# Patient Record
Sex: Male | Born: 1935 | Race: White | Hispanic: No | Smoking: Former smoker
Health system: Southern US, Community
[De-identification: ages and names within clinical notes are randomized; demographics above are authoritative.]

## PROBLEM LIST (undated history)

## (undated) DIAGNOSIS — H04123 Dry eye syndrome of bilateral lacrimal glands: Secondary | ICD-10-CM

## (undated) DIAGNOSIS — I519 Heart disease, unspecified: Secondary | ICD-10-CM

## (undated) DIAGNOSIS — Z86718 Personal history of other venous thrombosis and embolism: Secondary | ICD-10-CM

## (undated) DIAGNOSIS — J302 Other seasonal allergic rhinitis: Secondary | ICD-10-CM

## (undated) HISTORY — PX: EYE SURGERY: SHX253

## (undated) HISTORY — PX: OTHER SURGICAL HISTORY: SHX169

## (undated) HISTORY — DX: Other seasonal allergic rhinitis: J30.2

## (undated) HISTORY — DX: Heart disease, unspecified: I51.9

## (undated) HISTORY — DX: Dry eye syndrome of bilateral lacrimal glands: H04.123

## (undated) HISTORY — DX: Personal history of other venous thrombosis and embolism: Z86.718

---

## 2007-04-10 ENCOUNTER — Ambulatory Visit: Payer: Self-pay | Admitting: Otolaryngology

## 2007-04-20 ENCOUNTER — Ambulatory Visit: Payer: Self-pay | Admitting: Otolaryngology

## 2007-04-20 ENCOUNTER — Other Ambulatory Visit: Payer: Self-pay

## 2007-05-07 ENCOUNTER — Ambulatory Visit: Payer: Self-pay | Admitting: Otolaryngology

## 2009-03-10 ENCOUNTER — Ambulatory Visit: Payer: Self-pay | Admitting: Otolaryngology

## 2009-03-24 ENCOUNTER — Ambulatory Visit: Payer: Self-pay | Admitting: Otolaryngology

## 2009-03-26 ENCOUNTER — Inpatient Hospital Stay: Payer: Self-pay | Admitting: Specialist

## 2009-05-15 ENCOUNTER — Ambulatory Visit: Payer: Self-pay | Admitting: Vascular Surgery

## 2009-07-06 ENCOUNTER — Encounter: Payer: Self-pay | Admitting: Internal Medicine

## 2009-07-16 ENCOUNTER — Ambulatory Visit: Payer: Self-pay | Admitting: Family Medicine

## 2009-07-22 ENCOUNTER — Encounter: Payer: Self-pay | Admitting: Internal Medicine

## 2009-08-21 ENCOUNTER — Encounter: Payer: Self-pay | Admitting: Internal Medicine

## 2009-10-02 ENCOUNTER — Ambulatory Visit: Payer: Self-pay | Admitting: Allergy

## 2009-10-08 ENCOUNTER — Ambulatory Visit: Payer: Self-pay | Admitting: Family Medicine

## 2009-12-10 ENCOUNTER — Emergency Department: Payer: Self-pay | Admitting: Emergency Medicine

## 2010-02-19 ENCOUNTER — Inpatient Hospital Stay: Payer: Self-pay | Admitting: Internal Medicine

## 2010-03-07 ENCOUNTER — Emergency Department: Payer: Self-pay | Admitting: Emergency Medicine

## 2010-06-26 ENCOUNTER — Emergency Department: Payer: Self-pay | Admitting: Emergency Medicine

## 2011-03-05 ENCOUNTER — Emergency Department: Payer: Self-pay | Admitting: *Deleted

## 2011-03-06 LAB — CBC
HCT: 46.4 % (ref 40.0–52.0)
HGB: 15.5 g/dL (ref 13.0–18.0)
MCH: 31.1 pg (ref 26.0–34.0)
MCHC: 33.3 g/dL (ref 32.0–36.0)
MCV: 93 fL (ref 80–100)
RBC: 4.98 10*6/uL (ref 4.40–5.90)

## 2011-03-06 LAB — COMPREHENSIVE METABOLIC PANEL
Anion Gap: 10 (ref 7–16)
BUN: 21 mg/dL — ABNORMAL HIGH (ref 7–18)
Bilirubin,Total: 1.4 mg/dL — ABNORMAL HIGH (ref 0.2–1.0)
Calcium, Total: 8.6 mg/dL (ref 8.5–10.1)
Chloride: 104 mmol/L (ref 98–107)
Co2: 26 mmol/L (ref 21–32)
Creatinine: 1.2 mg/dL (ref 0.60–1.30)
EGFR (African American): >60
EGFR (Non-African Amer.): >60
Glucose: 110 mg/dL — ABNORMAL HIGH (ref 65–99)
Osmolality: 283 (ref 275–301)
Sodium: 140 mmol/L (ref 136–145)

## 2011-03-06 LAB — TROPONIN I: Troponin-I: 0.02 ng/mL

## 2011-03-06 LAB — CK TOTAL AND CKMB (NOT AT ARMC)
CK, Total: 116 U/L (ref 35–232)
CK-MB: 2.9 ng/mL (ref 0.5–3.6)

## 2011-08-12 ENCOUNTER — Emergency Department: Payer: Self-pay | Admitting: Emergency Medicine

## 2011-08-12 LAB — CBC
HCT: 44.3 % (ref 40.0–52.0)
MCHC: 32.1 g/dL (ref 32.0–36.0)
RBC: 4.76 10*6/uL (ref 4.40–5.90)
RDW: 16 % — ABNORMAL HIGH (ref 11.5–14.5)
WBC: 12.2 10*3/uL — ABNORMAL HIGH (ref 3.8–10.6)

## 2011-08-12 LAB — COMPREHENSIVE METABOLIC PANEL
Anion Gap: 6 — ABNORMAL LOW (ref 7–16)
BUN: 25 mg/dL — ABNORMAL HIGH (ref 7–18)
Bilirubin,Total: 2 mg/dL — ABNORMAL HIGH (ref 0.2–1.0)
Calcium, Total: 8.6 mg/dL (ref 8.5–10.1)
Chloride: 104 mmol/L (ref 98–107)
Co2: 24 mmol/L (ref 21–32)
Creatinine: 1.18 mg/dL (ref 0.60–1.30)
EGFR (African American): >60
EGFR (Non-African Amer.): 58 — ABNORMAL LOW
SGOT(AST): 14 U/L — ABNORMAL LOW (ref 15–37)
SGPT (ALT): 12 U/L
Sodium: 134 mmol/L — ABNORMAL LOW (ref 136–145)

## 2011-08-12 LAB — APTT: Activated PTT: 36.4 secs — ABNORMAL HIGH (ref 23.6–35.9)

## 2011-08-12 LAB — TROPONIN I: Troponin-I: 0.05 ng/mL

## 2011-08-12 LAB — CK TOTAL AND CKMB (NOT AT ARMC): CK, Total: 54 U/L (ref 35–232)

## 2011-08-12 LAB — PROTIME-INR
INR: 1
Prothrombin Time: 14.1 secs (ref 11.5–14.7)

## 2011-08-22 ENCOUNTER — Inpatient Hospital Stay: Payer: Self-pay | Admitting: Internal Medicine

## 2011-08-22 LAB — COMPREHENSIVE METABOLIC PANEL
Albumin: 3.2 g/dL — ABNORMAL LOW (ref 3.4–5.0)
Alkaline Phosphatase: 95 U/L (ref 50–136)
BUN: 22 mg/dL — ABNORMAL HIGH (ref 7–18)
Bilirubin,Total: 0.7 mg/dL (ref 0.2–1.0)
Calcium, Total: 9.3 mg/dL (ref 8.5–10.1)
Creatinine: 1.07 mg/dL (ref 0.60–1.30)
EGFR (Non-African Amer.): >60
Glucose: 198 mg/dL — ABNORMAL HIGH (ref 65–99)
Osmolality: 279 (ref 275–301)
Potassium: 4.1 mmol/L (ref 3.5–5.1)
SGPT (ALT): 17 U/L
Sodium: 135 mmol/L — ABNORMAL LOW (ref 136–145)
Total Protein: 8.2 g/dL (ref 6.4–8.2)

## 2011-08-22 LAB — CBC
HCT: 44.9 % (ref 40.0–52.0)
MCH: 29.9 pg (ref 26.0–34.0)
MCHC: 32.3 g/dL (ref 32.0–36.0)
MCV: 93 fL (ref 80–100)
Platelet: 343 10*3/uL (ref 150–440)
RBC: 4.85 10*6/uL (ref 4.40–5.90)
RDW: 15.4 % — ABNORMAL HIGH (ref 11.5–14.5)
WBC: 15.5 10*3/uL — ABNORMAL HIGH (ref 3.8–10.6)

## 2011-08-22 LAB — CK TOTAL AND CKMB (NOT AT ARMC)
CK, Total: 63 U/L (ref 35–232)
CK-MB: 2 ng/mL (ref 0.5–3.6)
CK-MB: 2.4 ng/mL (ref 0.5–3.6)

## 2011-08-22 LAB — URINALYSIS, COMPLETE
Blood: NEGATIVE
Hyaline Cast: 6
Ketone: NEGATIVE
Ph: 5 (ref 4.5–8.0)
Protein: NEGATIVE
Specific Gravity: 1.009 (ref 1.003–1.030)
Squamous Epithelial: NONE SEEN

## 2011-08-22 LAB — LIPID PANEL
HDL Cholesterol: 27 mg/dL — ABNORMAL LOW (ref 40–60)
Triglycerides: 106 mg/dL (ref 0–200)
VLDL Cholesterol, Calc: 21 mg/dL (ref 5–40)

## 2011-08-22 LAB — PROTIME-INR
INR: 0.9
Prothrombin Time: 12.4 secs (ref 11.5–14.7)

## 2011-08-22 LAB — TROPONIN I
Troponin-I: 0.02 ng/mL
Troponin-I: 0.03 ng/mL

## 2011-08-23 LAB — BASIC METABOLIC PANEL
Anion Gap: 11 (ref 7–16)
BUN: 34 mg/dL — ABNORMAL HIGH (ref 7–18)
Calcium, Total: 9.2 mg/dL (ref 8.5–10.1)
Creatinine: 1.46 mg/dL — ABNORMAL HIGH (ref 0.60–1.30)
EGFR (African American): 52 — ABNORMAL LOW
EGFR (Non-African Amer.): 45 — ABNORMAL LOW
Glucose: 110 mg/dL — ABNORMAL HIGH (ref 65–99)
Sodium: 136 mmol/L (ref 136–145)

## 2011-08-23 LAB — CK TOTAL AND CKMB (NOT AT ARMC)
CK, Total: 47 U/L (ref 35–232)
CK-MB: 2.4 ng/mL (ref 0.5–3.6)

## 2011-08-23 LAB — LIPID PANEL
Cholesterol: 114 mg/dL (ref 0–200)
HDL Cholesterol: 31 mg/dL — ABNORMAL LOW (ref 40–60)
VLDL Cholesterol, Calc: 18 mg/dL (ref 5–40)

## 2011-08-23 LAB — CBC WITH DIFFERENTIAL/PLATELET
Basophil #: 0 10*3/uL (ref 0.0–0.1)
Basophil %: 0.3 %
Eosinophil #: 0 10*3/uL (ref 0.0–0.7)
Eosinophil %: 0 %
HCT: 47.1 % (ref 40.0–52.0)
HGB: 14.9 g/dL (ref 13.0–18.0)
Lymphocyte #: 0.5 10*3/uL — ABNORMAL LOW (ref 1.0–3.6)
Lymphocyte %: 4.6 %
MCH: 29.5 pg (ref 26.0–34.0)
MCV: 93 fL (ref 80–100)
Monocyte %: 0.9 %
Neutrophil #: 10.1 10*3/uL — ABNORMAL HIGH (ref 1.4–6.5)
RBC: 5.05 10*6/uL (ref 4.40–5.90)
RDW: 15.2 % — ABNORMAL HIGH (ref 11.5–14.5)
WBC: 10.7 10*3/uL — ABNORMAL HIGH (ref 3.8–10.6)

## 2011-08-23 LAB — TROPONIN I: Troponin-I: 0.02 ng/mL

## 2011-09-28 ENCOUNTER — Other Ambulatory Visit: Payer: Self-pay | Admitting: Ophthalmology

## 2011-09-28 LAB — SEDIMENTATION RATE: Erythrocyte Sed Rate: 2 mm/hr (ref 0–20)

## 2011-11-09 ENCOUNTER — Ambulatory Visit: Payer: Self-pay | Admitting: Family Medicine

## 2011-12-06 ENCOUNTER — Ambulatory Visit: Payer: Self-pay | Admitting: Vascular Surgery

## 2011-12-21 IMAGING — US US CAROTID DUPLEX BILAT
1 series · 17 of 24 positions shown · non-contrast
Comparison: none

REASON FOR EXAM: peripheral vascular disease
COMMENTS:

[Series 1: us carotid duplex bilat · 17 of 65 slices shown]
[im 1/65]
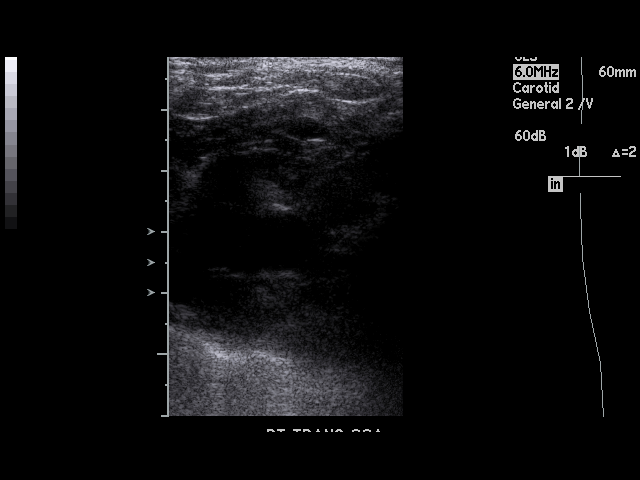
[im 6/65]
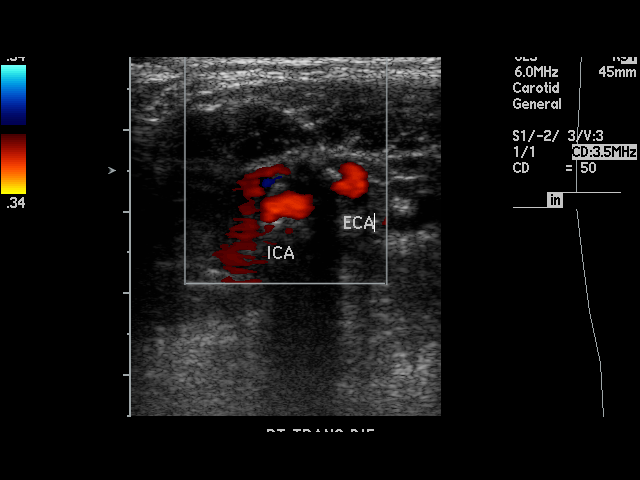
[im 9/65]
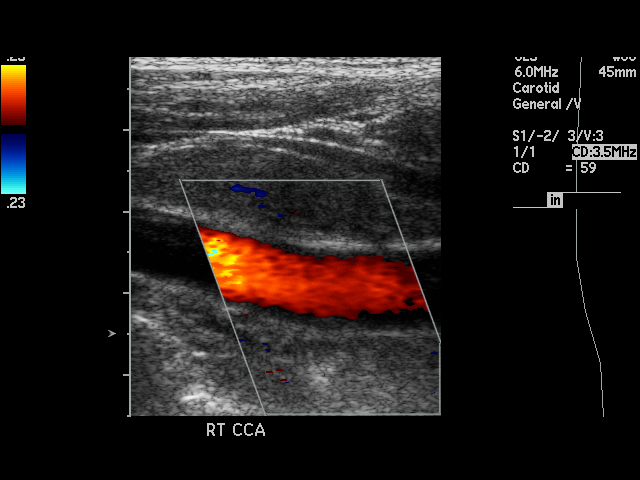
[im 12/65]
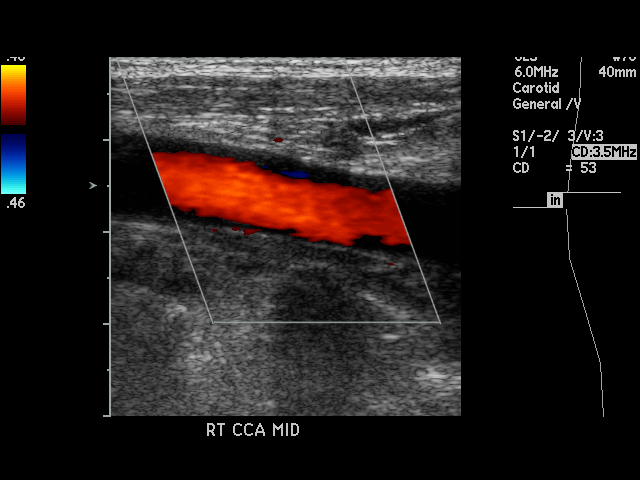
[im 17/65]
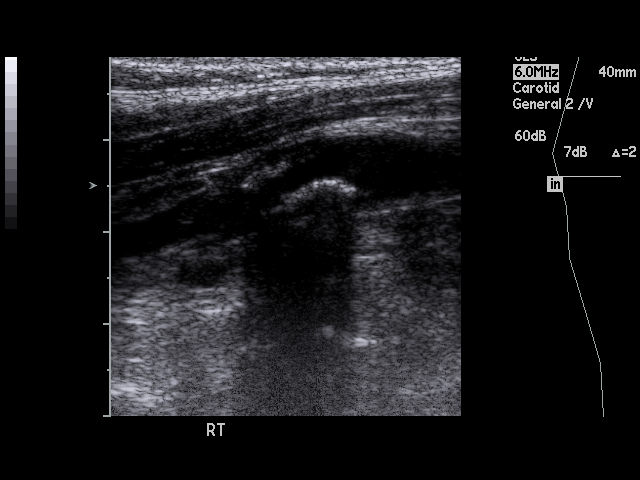
[im 20/65]
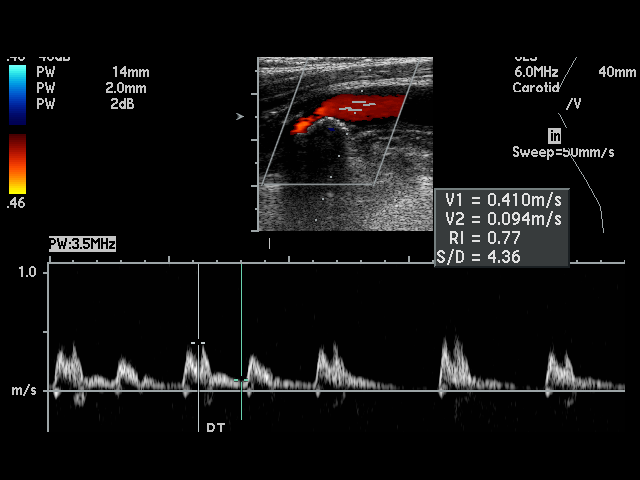
[im 26/65]
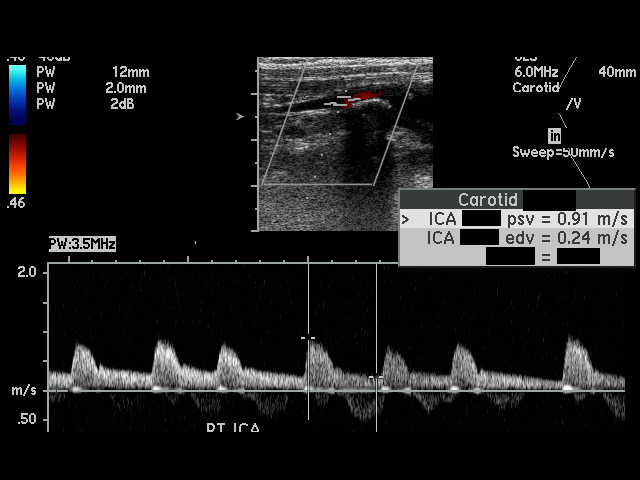
[im 28/65]
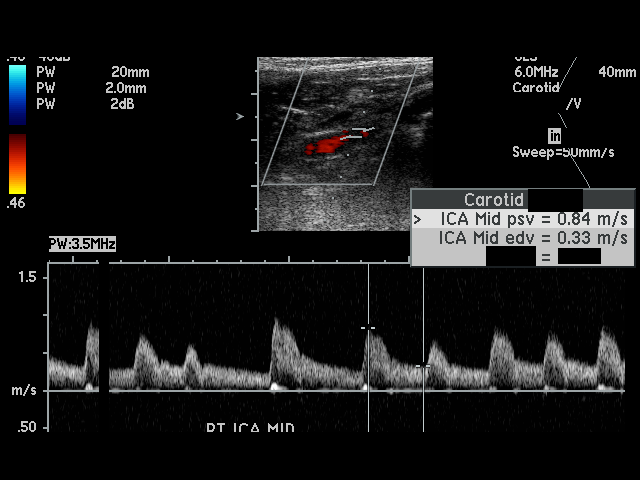
[im 34/65]
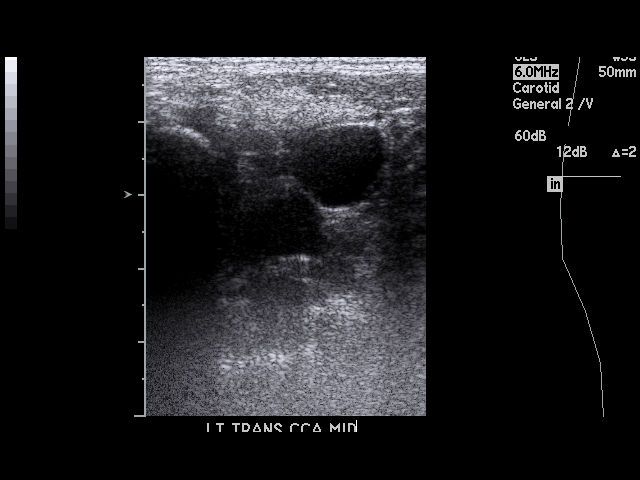
[im 37/65]
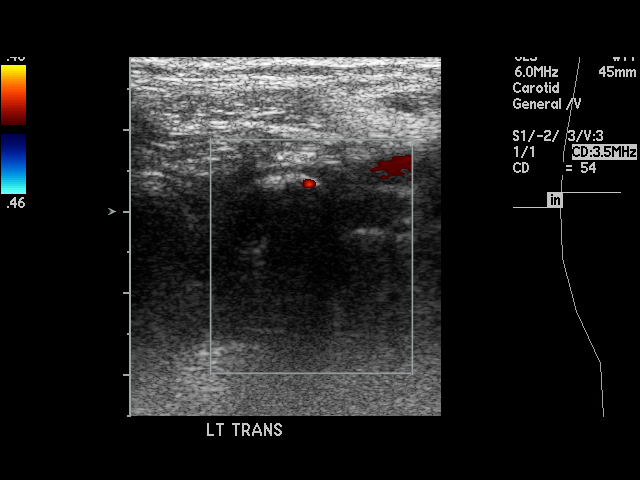
[im 39/65]
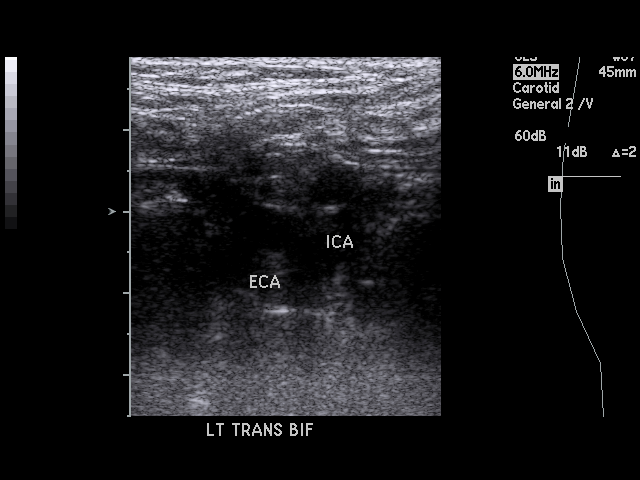
[im 45/65]
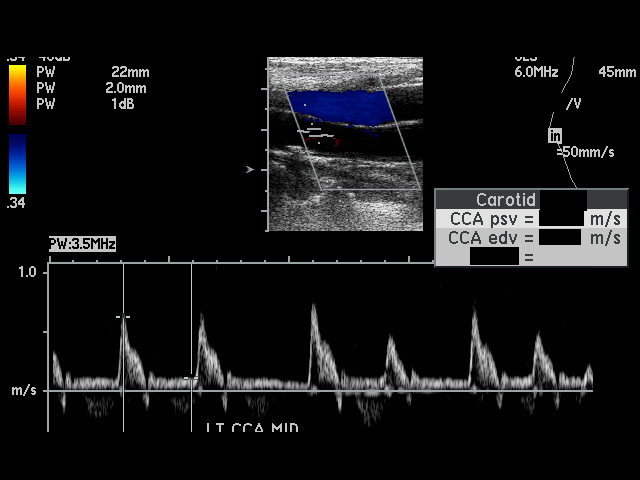
[im 48/65]
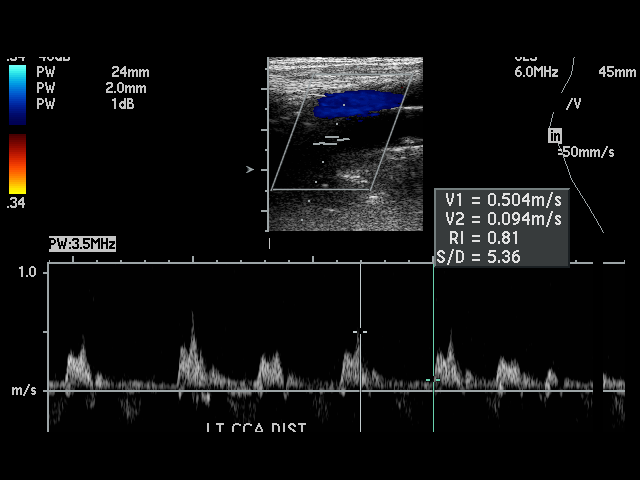
[im 53/65]
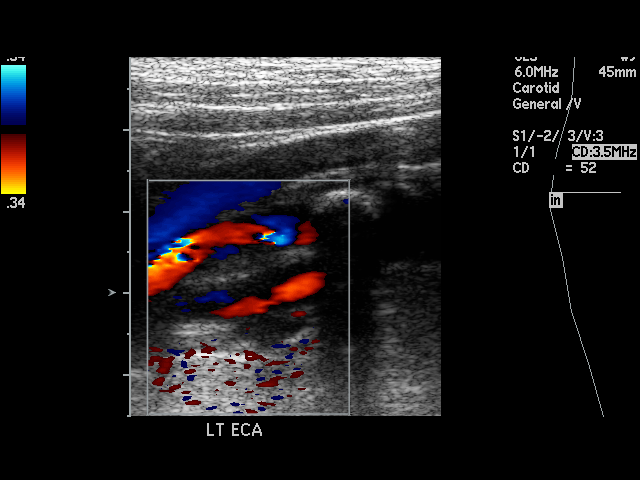
[im 56/65]
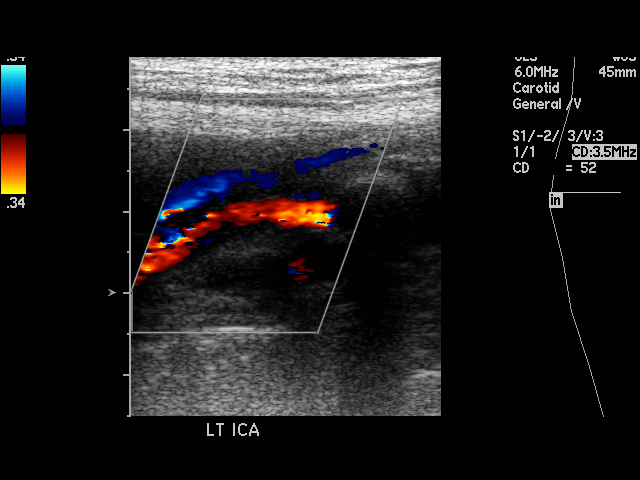
[im 59/65]
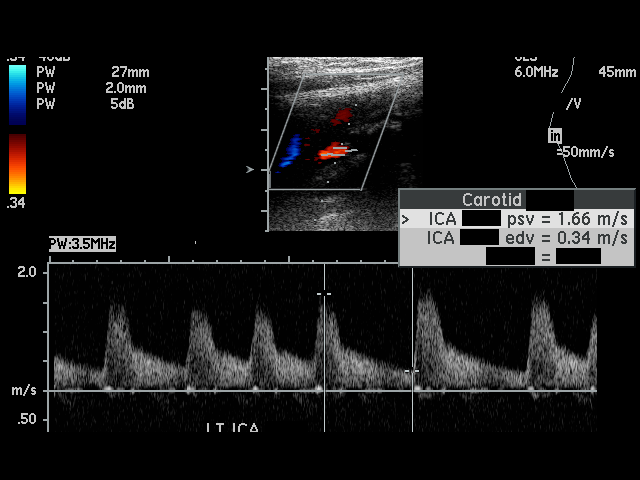
[im 65/65]
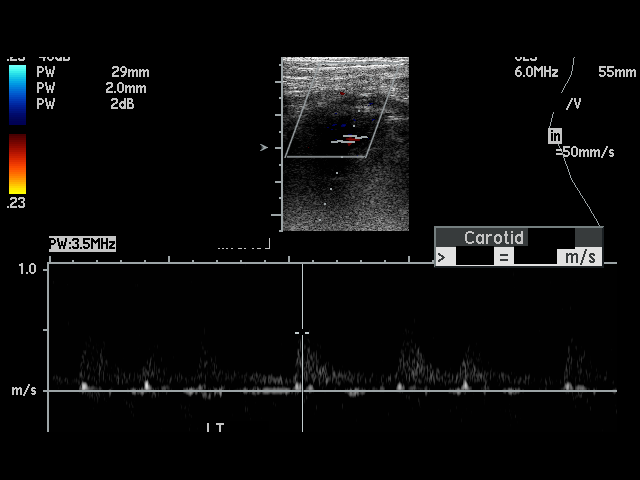

[17 of 24 positions shown; findings below may reference images not displayed]

PROCEDURE:     US  - US CAROTID DOPPLER BILATERAL  - April 02, 2009 [DATE]

RESULT:     On the right there is calcified and soft plaque at the bulb and
proximal ICA. The waveform patterns and the color flow images appear normal
on the right. Some cardiac rhythm irregularity is demonstrated. Peak
internal carotid systolic velocity on the right measures 91 cm per second
and peak common carotid velocity measures 90 cm per second corresponding to
a ratio of 1.1.

On the left the waveform patterns and color flow images do not reveal
findings suspicious for critical stenoses. There is soft and calcified
plaque at the carotid bulb and at the proximal ICA with there being somewhat
more plaque in the ICA on the left than on the right. Peak internal carotid
systolic velocity measures 167 cm per second and peak common carotid
velocity measures 63 cm second corresponding to an abnormal ratio of 2.6.

The vertebral arteries are normal in flow direction bilaterally.
IMPRESSION: 1. On the left there is acceleration of the peak internal carotid systolic
velocity and there is visual stenosis in the range of between 50 and 75%. On
the right I do not see evidence of hemodynamically significant carotid
stenosis.
2. The vertebral arteries are normal in flow direction bilaterally.
3. There is irregularity of the cardiac rhythm.

## 2013-01-22 ENCOUNTER — Ambulatory Visit: Payer: Self-pay | Admitting: Otolaryngology

## 2013-03-04 ENCOUNTER — Encounter: Payer: Self-pay | Admitting: Pulmonary Disease

## 2013-03-04 ENCOUNTER — Encounter (INDEPENDENT_AMBULATORY_CARE_PROVIDER_SITE_OTHER): Payer: Self-pay

## 2013-03-04 ENCOUNTER — Ambulatory Visit (INDEPENDENT_AMBULATORY_CARE_PROVIDER_SITE_OTHER): Payer: Medicare Other | Admitting: Pulmonary Disease

## 2013-03-04 VITALS — BP 110/62 | HR 82 | Temp 97.7°F | Ht 71.5 in | Wt 175.4 lb

## 2013-03-04 DIAGNOSIS — R0981 Nasal congestion: Secondary | ICD-10-CM | POA: Insufficient documentation

## 2013-03-04 DIAGNOSIS — R059 Cough, unspecified: Secondary | ICD-10-CM | POA: Insufficient documentation

## 2013-03-04 DIAGNOSIS — J3489 Other specified disorders of nose and nasal sinuses: Secondary | ICD-10-CM

## 2013-03-04 DIAGNOSIS — I10 Essential (primary) hypertension: Secondary | ICD-10-CM | POA: Insufficient documentation

## 2013-03-04 DIAGNOSIS — R05 Cough: Secondary | ICD-10-CM

## 2013-03-04 MED ORDER — IRBESARTAN 75 MG PO TABS: 75.0000 mg | ORAL_TABLET | Freq: Every day | ORAL | Status: AC

## 2013-03-04 MED ORDER — AZELASTINE-FLUTICASONE 137-50 MCG/ACT NA SUSP: NASAL | Status: AC

## 2013-03-04 NOTE — Assessment & Plan Note (Signed)
Stop lisinopril Start Avapro 75 mg daily

## 2013-03-04 NOTE — Progress Notes (Signed)
Subjective:    Patient ID: David Farley, male    DOB: 1929-06-06, 80 y.o.   MRN: [id number]  HPI   [name] is here to see me today because Dr. Andee PolesVaught referred him here for evaluation of cough. The Chest X-ray which was reently ordered showed some chronic peribronchial thickenings.  He has been coughing up yellow phlegm and he occasionally notices a yellow-grey color in the sputum.    He notes that he has had the cough for quite some time. He worked as a Programmer, applicationsprofessional trombonist for several years and says that he learned diaphragm breathing and has always. He actually lived around [place] and worked for [employer].  He moved from [place] about 5 years ago and had no respiratory problems.  He previous smoked cigars many years ago when he was in the [employer] in [place] when he would drink beer.  He never smoked otherwise.  [place] years ago he developed sinus symptoms with an associated cough.  In the last year he has had an increase in the amount of sputum.  He describes coughing up a lot of mucus.  He has not had fever, chills, weight loss or night sweats.  He has a lot of sinus congestion which is causing cough at night.  He coughs all day.  He has a lot of sinus congestion which is worse when he lies flat.  He had a CABG about 3-4 years ago at [facility].  Dr. Andee PolesVaught recently stopped several sinus medicines he had been taking for chronic, nightly sinus congestion.  He recommended that he start using Dymista.  He does not feel like that he is having any shortness of breath.    Past Medical History  Diagnosis Date  . Heart disease, unspecified   . History of blood clots   . Seasonal allergies   . Dry eyes      No family history on file.   History   Social History  . Marital Status: Single    Spouse Name: N/A    Number of Children: N/A  . Years of Education: N/A   Occupational History  . Not on file.   Social History Main Topics  . Smoking  status: Former Smoker -- 22 years    Types: Cigars    Quit date: 02/21/1974  . Smokeless tobacco: Never Used  . Alcohol Use: No  . Drug Use: Not on file  . Sexual Activity: Not on file   Other Topics Concern  . Not on file   Social History Narrative  . No narrative on file     Not on File   No outpatient prescriptions prior to visit.   No facility-administered medications prior to visit.      Review of Systems  Constitutional: Negative for fever and unexpected weight change.  HENT: Positive for congestion and postnasal drip. Negative for dental problem, ear pain, nosebleeds, rhinorrhea, sinus pressure, sneezing, sore throat and trouble swallowing.   Eyes: Positive for redness and itching.  Respiratory: Positive for cough and wheezing. Negative for chest tightness and shortness of breath.   Cardiovascular: Negative for palpitations and leg swelling.  Gastrointestinal: Negative for nausea and vomiting.  Genitourinary: Negative for dysuria.  Musculoskeletal: Negative for joint swelling.  Skin: Negative for rash.  Neurological: Negative for headaches.  Hematological: Does not bruise/bleed easily.  Psychiatric/Behavioral: Negative for dysphoric mood. The patient is not nervous/anxious.        Objective:  Physical Exam Filed Vitals:   03/04/13 1538  BP: 110/62  Pulse: 82  Temp: 97.7 F (36.5 C)  TempSrc: Oral  Height: 5' 11.5" (1.816 m)  Weight: 175 lb 6.4 oz (79.561 kg)  SpO2: 97%  RA  Gen: well appearing, no acute distress HEENT: NCAT, PERRL, EOMi, OP clear, neck supple without masses PULM: CTA B CV: RRR, no mgr, no JVD AB: BS+, soft, nontender, no hsm Ext: warm, no edema, no clubbing, no cyanosis Derm: no rash or skin breakdown Neuro: A&Ox4, CN II-XII intact, strength 5/5 in all 4 extremities  01/22/2013 CXR> chronic peribronchial thickening but no clear parenchymal or other abnormality      Assessment & Plan:   Cough I explained to Mr. Brisendine that  given his normal exam, oximetry and essentially normal Chest x-ray that I don't see clear evidence of lung disease right now.  The most likely explanation for his cough is the lisinopril and post nasal drip.  The productive cough bothers me somewhat, but I think that this is likely from all his sinus congestion.    Plan: -instructed on proper Dymista use and refilled it for him -Stop lisinopril -Start Avapro -f/u three months, if no improvement, then return to clinic for further evaluation.  Essential hypertension, benign Stop lisinopril Start Avapro 75 mg daily  Sinus congestion Instructed on proper technique and pharmacokinetics of Dymista. Refilled dymista    Updated Medication List Outpatient Encounter Prescriptions as of 03/04/2013  Medication Sig  . acetaminophen (TYLENOL) 500 MG tablet Take 1,000 mg by mouth every 6 (six) hours as needed.  Marland Kitchen antiseptic oral rinse (BIOTENE) LIQD 15 mLs by Mouth Rinse route as needed for dry mouth.  Marland Kitchen aspirin 325 MG tablet Take 325 mg by mouth daily.  . Azelastine-Fluticasone (DYMISTA) 137-50 MCG/ACT SUSP One spray per nostril twice daily  . benzocaine (ORAJEL) 10 % mucosal gel Use as directed 1 application in the mouth or throat 3 (three) times daily as needed for mouth pain.  . bimatoprost (LUMIGAN) 0.03 % ophthalmic solution Place 2 drops into both eyes at bedtime.  . Brinzolamide-Brimonidine (SIMBRINZA OP) Apply 1 drop to eye. Each eye tid  . Camphor-Eucalyptus-Menthol (VAPORIZING CHEST RUB EX) Apply topically. Apply inside each nare qhs  . carboxymethylcellulose (REFRESH PLUS) 0.5 % SOLN 2 drops 3 (three) times daily as needed.  . carvedilol (COREG) 3.125 MG tablet Take 3.125 mg by mouth 2 (two) times daily with a meal.  . cetirizine (ZYRTEC) 10 MG tablet Take 10 mg by mouth daily.  . furosemide (LASIX) 20 MG tablet Take 20 mg by mouth daily.  Marland Kitchen guaiFENesin (ROBITUSSIN) 100 MG/5ML SOLN Take 5 mLs by mouth every 4 (four) hours as needed for  cough or to loosen phlegm.  Marland Kitchen ipratropium (ATROVENT) 0.02 % nebulizer solution Take 0.5 mg by nebulization every 6 (six) hours as needed for wheezing or shortness of breath.  . irbesartan (AVAPRO) 75 MG tablet Take 1 tablet (75 mg total) by mouth daily.  . Menthol (HALLS COUGH DROPS) 5.8 MG LOZG Use as directed in the mouth or throat. Take prn to suppress cough  . Multiple Vitamin (MULTIVITAMIN) tablet Take 1 tablet by mouth daily.  Marland Kitchen OVER THE COUNTER MEDICATION Occuvite with Lutein.  1 po bid  . simvastatin (ZOCOR) 20 MG tablet Take 20 mg by mouth daily.  . tamsulosin (FLOMAX) 0.4 MG CAPS capsule Take 0.4 mg by mouth daily.  . TURMERIC PO Take by mouth. 1 po qd  . [DISCONTINUED]  Azelastine-Fluticasone (DYMISTA) 137-50 MCG/ACT SUSP Place into the nose. 1-2 sprays daily  . [DISCONTINUED] lisinopril (PRINIVIL,ZESTRIL) 5 MG tablet Take 5 mg by mouth daily.

## 2013-03-04 NOTE — Assessment & Plan Note (Signed)
Instructed on proper technique and pharmacokinetics of Dymista. Refilled dymista

## 2013-03-04 NOTE — Patient Instructions (Signed)
Stop lisinopril Start Avapro 75mg  daily for your blood pressure Use Dymista one puff each nostril twice per day We will see you back in 3 months or sooner if needed

## 2013-03-04 NOTE — Assessment & Plan Note (Signed)
I explained to [name] that given his normal exam, oximetry and essentially normal Chest x-ray that I don't see clear evidence of lung disease right now.  The most likely explanation for his cough is the lisinopril and post nasal drip.  The productive cough bothers me somewhat, but I think that this is likely from all his sinus congestion.    Plan: -instructed on proper Dymista use and refilled it for him -Stop lisinopril -Start Avapro -f/u three months, if no improvement, then return to clinic for further evaluation.

## 2013-03-18 ENCOUNTER — Ambulatory Visit (INDEPENDENT_AMBULATORY_CARE_PROVIDER_SITE_OTHER): Payer: Medicare Other | Admitting: Pulmonary Disease

## 2013-03-18 ENCOUNTER — Telehealth: Payer: Self-pay | Admitting: Pulmonary Disease

## 2013-03-18 ENCOUNTER — Encounter: Payer: Self-pay | Admitting: Pulmonary Disease

## 2013-03-18 VITALS — BP 124/76 | HR 85 | Temp 97.5°F | Ht 71.5 in | Wt 170.0 lb

## 2013-03-18 DIAGNOSIS — R059 Cough, unspecified: Secondary | ICD-10-CM

## 2013-03-18 DIAGNOSIS — R05 Cough: Secondary | ICD-10-CM

## 2013-03-18 MED ORDER — HYDROCOD POLST-CHLORPHEN POLST 10-8 MG/5ML PO LQCR: 5.0000 mL | Freq: Every evening | ORAL | Status: AC | PRN

## 2013-03-18 NOTE — Assessment & Plan Note (Addendum)
David Farley's chronic cough improved quite a bit after stopping the lisinopril on the last visit. However he currently has 2 days of cough with mucus production. This very likely represents an acute viral illness. We will test him for influenza but I am encouraged by the fact that he does not have a fever chills or body aches.  I believe that the cough he is seeing me for today is not related to the chronic cough which was due to the ACE inhibitor.  Plan: - Supportive care encouraged: Plenty of fluids, Tussionex for cough - Check a chest x-ray to ensure no pneumonia - Continue nasal steroid and saline rinses - Mucus culture - Rapid flu test today

## 2013-03-18 NOTE — Patient Instructions (Signed)
Please provide us with a sample of your mucus We will order a Chest X[facility]ray at [facility] to evaluate your cough Use the tussionex at night for your cough (this medicine will make you drowsy) Drink plenty of fluids Continue using the saline rinses and the nose spray Dr. Andee PolesVaught prescribed Use over the counter decongestant pills like phenylephrine or pseudophed as needed for the nose congestion  We will see you back as previously scheduled

## 2013-03-18 NOTE — Progress Notes (Signed)
Subjective:    Patient ID: David Farley, male    DOB: 1929/03/28, 80 y.o.   MRN: [id number]  Synopsis: David MemsWilliam Jeffus first saw the [facility] pulmonary office in 02/2013 for cough related to post nasal drip and an Ace-Inhibitor.  HPI  03/18/2013 ROV >> [name] came in today because in the last two days he has had a lot of mucus production. Last night he had a lot of mucus in particular.  Initially he did improve after the last visit and the cough really improved a lot and he had less mucus production.  He said the cough was nearly gone until this past weekend.  However last night it got a lot worse with a lot of mucus production.  He has been sneezing a lot since the last visit.  He has had some shortness of breath over the weekend as well, but he denies fevers, chills, headaches, or body aches.  Past Medical History  Diagnosis Date  . Heart disease, unspecified   . History of blood clots   . Seasonal allergies   . Dry eyes      Review of Systems     Objective:   Physical Exam  Filed Vitals:   03/18/13 1207  BP: 124/76  Pulse: 85  Temp: 97.5 F (36.4 C)  TempSrc: Oral  Height: 5' 11.5" (1.816 m)  Weight: 170 lb (77.111 kg)  SpO2: 96%  RA  Gen: well appearing HEENT: NCAT, OP clear PULM: Few crackles and focal wheeze left base, all else clear CV: RRR, no mgr AB: BS+, soft, nontender Ext: warm, no edema       Assessment & Plan:   Cough David Farley's chronic cough improved quite a bit after stopping the lisinopril on the last visit. However he currently has 2 days of cough with mucus production. This very likely represents an acute viral illness. We will test him for influenza but I am encouraged by the fact that he does not have a fever chills or body aches.  I believe that the cough he is seeing me for today is not related to the chronic cough which was due to the ACE inhibitor.  Plan: - Supportive care encouraged: Plenty of fluids, Tussionex for cough -  Check a chest x-ray to ensure no pneumonia - Continue nasal steroid and saline rinses - Mucus culture - Rapid flu test today   Updated Medication List Outpatient Encounter Prescriptions as of 03/18/2013  Medication Sig  . acetaminophen (TYLENOL) 500 MG tablet Take 1,000 mg by mouth every 6 (six) hours as needed.  Marland Kitchen. antiseptic oral rinse (BIOTENE) LIQD 15 mLs by Mouth Rinse route as needed for dry mouth.  Marland Kitchen. aspirin 325 MG tablet Take 325 mg by mouth daily.  . Azelastine-Fluticasone (DYMISTA) 137-50 MCG/ACT SUSP One spray per nostril twice daily  . benzocaine (ORAJEL) 10 % mucosal gel Use as directed 1 application in the mouth or throat 3 (three) times daily as needed for mouth pain.  . bimatoprost (LUMIGAN) 0.03 % ophthalmic solution Place 2 drops into both eyes at bedtime.  . Brinzolamide-Brimonidine (SIMBRINZA OP) Apply 1 drop to eye. Each eye tid  . Camphor-Eucalyptus-Menthol (VAPORIZING CHEST RUB EX) Apply topically. Apply inside each nare qhs  . carboxymethylcellulose (REFRESH PLUS) 0.5 % SOLN 2 drops 3 (three) times daily as needed.  . carvedilol (COREG) 3.125 MG tablet Take 3.125 mg by mouth 2 (two) times daily with a meal.  . cetirizine (ZYRTEC) 10 MG tablet Take 10  mg by mouth daily.  . furosemide (LASIX) 20 MG tablet Take 20 mg by mouth daily.  Marland Kitchen guaiFENesin (ROBITUSSIN) 100 MG/5ML SOLN Take 5 mLs by mouth every 4 (four) hours as needed for cough or to loosen phlegm.  Marland Kitchen ipratropium (ATROVENT) 0.02 % nebulizer solution Take 0.5 mg by nebulization every 6 (six) hours as needed for wheezing or shortness of breath.  . irbesartan (AVAPRO) 75 MG tablet Take 1 tablet (75 mg total) by mouth daily.  . Menthol (HALLS COUGH DROPS) 5.8 MG LOZG Use as directed in the mouth or throat. Take prn to suppress cough  . Multiple Vitamin (MULTIVITAMIN) tablet Take 1 tablet by mouth daily.  Marland Kitchen OVER THE COUNTER MEDICATION Occuvite with Lutein.  1 po bid  . simvastatin (ZOCOR) 20 MG tablet Take 20 mg by  mouth daily.  . tamsulosin (FLOMAX) 0.4 MG CAPS capsule Take 0.4 mg by mouth daily.  . TURMERIC PO Take by mouth. 1 po qd  . chlorpheniramine-HYDROcodone (TUSSIONEX PENNKINETIC ER) 10-8 MG/5ML LQCR Take 5 mLs by mouth at bedtime as needed for cough.     No problem-specific assessment & plan notes found for this encounter.   Updated Medication List Outpatient Encounter Prescriptions as of 03/18/2013  Medication Sig  . acetaminophen (TYLENOL) 500 MG tablet Take 1,000 mg by mouth every 6 (six) hours as needed.  Marland Kitchen antiseptic oral rinse (BIOTENE) LIQD 15 mLs by Mouth Rinse route as needed for dry mouth.  Marland Kitchen aspirin 325 MG tablet Take 325 mg by mouth daily.  . Azelastine-Fluticasone (DYMISTA) 137-50 MCG/ACT SUSP One spray per nostril twice daily  . benzocaine (ORAJEL) 10 % mucosal gel Use as directed 1 application in the mouth or throat 3 (three) times daily as needed for mouth pain.  . bimatoprost (LUMIGAN) 0.03 % ophthalmic solution Place 2 drops into both eyes at bedtime.  . Brinzolamide-Brimonidine (SIMBRINZA OP) Apply 1 drop to eye. Each eye tid  . Camphor-Eucalyptus-Menthol (VAPORIZING CHEST RUB EX) Apply topically. Apply inside each nare qhs  . carboxymethylcellulose (REFRESH PLUS) 0.5 % SOLN 2 drops 3 (three) times daily as needed.  . carvedilol (COREG) 3.125 MG tablet Take 3.125 mg by mouth 2 (two) times daily with a meal.  . cetirizine (ZYRTEC) 10 MG tablet Take 10 mg by mouth daily.  . furosemide (LASIX) 20 MG tablet Take 20 mg by mouth daily.  Marland Kitchen guaiFENesin (ROBITUSSIN) 100 MG/5ML SOLN Take 5 mLs by mouth every 4 (four) hours as needed for cough or to loosen phlegm.  Marland Kitchen ipratropium (ATROVENT) 0.02 % nebulizer solution Take 0.5 mg by nebulization every 6 (six) hours as needed for wheezing or shortness of breath.  . irbesartan (AVAPRO) 75 MG tablet Take 1 tablet (75 mg total) by mouth daily.  . Menthol (HALLS COUGH DROPS) 5.8 MG LOZG Use as directed in the mouth or throat. Take prn to  suppress cough  . Multiple Vitamin (MULTIVITAMIN) tablet Take 1 tablet by mouth daily.  Marland Kitchen OVER THE COUNTER MEDICATION Occuvite with Lutein.  1 po bid  . simvastatin (ZOCOR) 20 MG tablet Take 20 mg by mouth daily.  . tamsulosin (FLOMAX) 0.4 MG CAPS capsule Take 0.4 mg by mouth daily.  . TURMERIC PO Take by mouth. 1 po qd

## 2013-03-18 NOTE — Telephone Encounter (Signed)
atc na, wcb

## 2013-03-19 NOTE — Telephone Encounter (Signed)
ATC, line busy. WCB. Takhia Spoon, CMA  

## 2013-03-20 NOTE — Telephone Encounter (Signed)
I spoke with the pt and he states the tussionex was too expensive. I advised that his insurance will not cover cough syrups. He states that he is better and does not need any at this time. Carron CurieJennifer Castillo, CMA

## 2013-04-12 ENCOUNTER — Inpatient Hospital Stay: Payer: Self-pay | Admitting: Internal Medicine

## 2013-04-12 LAB — URINALYSIS, COMPLETE
BILIRUBIN, UR: NEGATIVE
Bacteria: NONE SEEN
Blood: NEGATIVE
GLUCOSE, UR: NEGATIVE mg/dL (ref 0–75)
Ketone: NEGATIVE
LEUKOCYTE ESTERASE: NEGATIVE
Nitrite: NEGATIVE
PH: 7 (ref 4.5–8.0)
Protein: NEGATIVE
RBC,UR: NONE SEEN /HPF (ref 0–5)
Specific Gravity: 1.008 (ref 1.003–1.030)
Squamous Epithelial: <1
WBC UR: <1 /HPF (ref 0–5)

## 2013-04-12 LAB — COMPREHENSIVE METABOLIC PANEL
ALBUMIN: 3.4 g/dL (ref 3.4–5.0)
ALK PHOS: 82 U/L
ALT: 25 U/L (ref 12–78)
AST: 28 U/L (ref 15–37)
Anion Gap: 4 — ABNORMAL LOW (ref 7–16)
BUN: 23 mg/dL — ABNORMAL HIGH (ref 7–18)
Bilirubin,Total: 1.2 mg/dL — ABNORMAL HIGH (ref 0.2–1.0)
CREATININE: 1.25 mg/dL (ref 0.60–1.30)
Calcium, Total: 9.1 mg/dL (ref 8.5–10.1)
Chloride: 104 mmol/L (ref 98–107)
Co2: 28 mmol/L (ref 21–32)
EGFR (African American): >60
EGFR (Non-African Amer.): 54 — ABNORMAL LOW
GLUCOSE: 95 mg/dL (ref 65–99)
OSMOLALITY: 275 (ref 275–301)
Potassium: 4.3 mmol/L (ref 3.5–5.1)
Sodium: 136 mmol/L (ref 136–145)
Total Protein: 7.4 g/dL (ref 6.4–8.2)

## 2013-04-12 LAB — CBC
HCT: 48.8 % (ref 40.0–52.0)
HGB: 16.4 g/dL (ref 13.0–18.0)
MCH: 32 pg (ref 26.0–34.0)
MCHC: 33.5 g/dL (ref 32.0–36.0)
MCV: 95 fL (ref 80–100)
Platelet: 195 10*3/uL (ref 150–440)
RBC: 5.12 10*6/uL (ref 4.40–5.90)
RDW: 13.7 % (ref 11.5–14.5)
WBC: 5.4 10*3/uL (ref 3.8–10.6)

## 2013-04-12 LAB — APTT: ACTIVATED PTT: 29 s (ref 23.6–35.9)

## 2013-04-12 LAB — PROTIME-INR
INR: 1.1
INR: 1.2
PROTHROMBIN TIME: 13.7 s (ref 11.5–14.7)
PROTHROMBIN TIME: 14.7 s (ref 11.5–14.7)

## 2013-04-13 ENCOUNTER — Ambulatory Visit: Payer: Self-pay | Admitting: Orthopedic Surgery

## 2013-04-13 LAB — BASIC METABOLIC PANEL
ANION GAP: 5 — AB (ref 7–16)
BUN: 23 mg/dL — AB (ref 7–18)
Calcium, Total: 8.9 mg/dL (ref 8.5–10.1)
Chloride: 102 mmol/L (ref 98–107)
Co2: 26 mmol/L (ref 21–32)
Creatinine: 1.43 mg/dL — ABNORMAL HIGH (ref 0.60–1.30)
EGFR (African American): 53 — ABNORMAL LOW
GFR CALC NON AF AMER: 46 — AB
Glucose: 132 mg/dL — ABNORMAL HIGH (ref 65–99)
Osmolality: 272 (ref 275–301)
Potassium: 4.2 mmol/L (ref 3.5–5.1)
Sodium: 133 mmol/L — ABNORMAL LOW (ref 136–145)

## 2013-04-13 LAB — CBC WITH DIFFERENTIAL/PLATELET
BASOS PCT: 0.2 %
Basophil #: 0 10*3/uL (ref 0.0–0.1)
Eosinophil #: 0.1 10*3/uL (ref 0.0–0.7)
Eosinophil %: 1.9 %
HCT: 48.6 % (ref 40.0–52.0)
HGB: 15.9 g/dL (ref 13.0–18.0)
LYMPHS ABS: 1.2 10*3/uL (ref 1.0–3.6)
Lymphocyte %: 16.7 %
MCH: 31.4 pg (ref 26.0–34.0)
MCHC: 32.8 g/dL (ref 32.0–36.0)
MCV: 96 fL (ref 80–100)
MONO ABS: 0.9 x10 3/mm (ref 0.2–1.0)
MONOS PCT: 12.2 %
NEUTROS ABS: 4.9 10*3/uL (ref 1.4–6.5)
Neutrophil %: 69 %
PLATELETS: 189 10*3/uL (ref 150–440)
RBC: 5.07 10*6/uL (ref 4.40–5.90)
RDW: 13.7 % (ref 11.5–14.5)
WBC: 7.1 10*3/uL (ref 3.8–10.6)

## 2013-04-16 LAB — BASIC METABOLIC PANEL
Anion Gap: 8 (ref 7–16)
BUN: 22 mg/dL — AB (ref 7–18)
CHLORIDE: 101 mmol/L (ref 98–107)
CREATININE: 1.16 mg/dL (ref 0.60–1.30)
Calcium, Total: 7.9 mg/dL — ABNORMAL LOW (ref 8.5–10.1)
Co2: 24 mmol/L (ref 21–32)
EGFR (African American): >60
GFR CALC NON AF AMER: 59 — AB
Glucose: 134 mg/dL — ABNORMAL HIGH (ref 65–99)
Osmolality: 272 (ref 275–301)
Potassium: 3.9 mmol/L (ref 3.5–5.1)
Sodium: 133 mmol/L — ABNORMAL LOW (ref 136–145)

## 2013-04-16 LAB — HEMOGLOBIN: HGB: 12.3 g/dL — AB (ref 13.0–18.0)

## 2013-04-16 LAB — PLATELET COUNT: Platelet: 176 10*3/uL (ref 150–440)

## 2013-04-17 ENCOUNTER — Encounter: Payer: Self-pay | Admitting: Internal Medicine

## 2013-04-17 LAB — BASIC METABOLIC PANEL
Anion Gap: 10 (ref 7–16)
BUN: 25 mg/dL — ABNORMAL HIGH (ref 7–18)
CHLORIDE: 101 mmol/L (ref 98–107)
CREATININE: 1.15 mg/dL (ref 0.60–1.30)
Calcium, Total: 8.3 mg/dL — ABNORMAL LOW (ref 8.5–10.1)
Co2: 22 mmol/L (ref 21–32)
EGFR (Non-African Amer.): 59 — ABNORMAL LOW
Glucose: 99 mg/dL (ref 65–99)
Osmolality: 271 (ref 275–301)
Potassium: 4 mmol/L (ref 3.5–5.1)
Sodium: 133 mmol/L — ABNORMAL LOW (ref 136–145)

## 2013-04-17 LAB — HEMOGLOBIN: HGB: 13 g/dL (ref 13.0–18.0)

## 2013-04-18 LAB — PATHOLOGY REPORT

## 2013-04-21 ENCOUNTER — Encounter: Payer: Self-pay | Admitting: Internal Medicine

## 2013-06-26 ENCOUNTER — Ambulatory Visit (INDEPENDENT_AMBULATORY_CARE_PROVIDER_SITE_OTHER): Payer: Medicare Other

## 2013-06-26 ENCOUNTER — Ambulatory Visit (INDEPENDENT_AMBULATORY_CARE_PROVIDER_SITE_OTHER): Payer: Medicare Other | Admitting: Podiatry

## 2013-06-26 DIAGNOSIS — I998 Other disorder of circulatory system: Secondary | ICD-10-CM

## 2013-06-26 DIAGNOSIS — L97529 Non-pressure chronic ulcer of other part of left foot with unspecified severity: Secondary | ICD-10-CM

## 2013-06-26 DIAGNOSIS — L97509 Non-pressure chronic ulcer of other part of unspecified foot with unspecified severity: Secondary | ICD-10-CM

## 2013-06-26 DIAGNOSIS — L8990 Pressure ulcer of unspecified site, unspecified stage: Secondary | ICD-10-CM

## 2013-06-26 NOTE — Progress Notes (Signed)
   Subjective:    Patient ID: David Farley, male    DOB: 1929-07-05, 80 y.o.   MRN: [id number]  HPI Comments: The left foot it sore and red and very  Painful, was in the hospital and the bed was too small , and the heel seem to rub a bed sore on it. This was back in February. [facility] did nothing for the foot , but just released me from the hospital , have had no treatment for this foot   Foot Pain Associated symptoms include coughing. Pertinent negatives include no fever.      Review of Systems  Constitutional: Positive for activity change and appetite change. Negative for fever.  Eyes: Positive for itching and visual disturbance.  Respiratory: Positive for cough.   Gastrointestinal: Positive for diarrhea.  Endocrine:       Excessive thirst  Increased urination        Objective:   Physical Exam: I have reviewed his past medical history medications allergies surgeries social history and review of systems. Once he left the hospital from having surgery to repair a fractured femur he went to [facility] on [address] in [place]. He developed his ulcerations at this facility and no one to care them. Vital signs are stable he is alert and oriented x3. His pulses are nonpalpable bilateral. Neurologic sensorium seems to be hypersensitive and deep tendon reflexes are non-elicitable. Muscle strength was deferred orthopedic evaluation demonstrates rectus foot type cutaneous evaluation demonstrates red tight swollen will skin multiple excoriations and skin breakdown to the heels with very large rash are to the left foot exquisitely painful smaller eschar to the right foot less painful.        Assessment & Plan:  Assessment: Peripheral vascular disease and decubitus ulcerations with cellulitis.  Plan: We immediately sent him for vascular evaluation. We dispensed heel protectors and I will followup with him should the wound care center not except him. We also referred him to  [facility]

## 2013-07-02 ENCOUNTER — Emergency Department: Payer: Self-pay | Admitting: Emergency Medicine

## 2013-07-02 LAB — CBC
HCT: 43.5 % (ref 40.0–52.0)
HGB: 14.1 g/dL (ref 13.0–18.0)
MCH: 29.4 pg (ref 26.0–34.0)
MCHC: 32.4 g/dL (ref 32.0–36.0)
MCV: 91 fL (ref 80–100)
PLATELETS: 306 10*3/uL (ref 150–440)
RBC: 4.78 10*6/uL (ref 4.40–5.90)
RDW: 15.7 % — ABNORMAL HIGH (ref 11.5–14.5)
WBC: 8.2 10*3/uL (ref 3.8–10.6)

## 2013-07-02 LAB — COMPREHENSIVE METABOLIC PANEL
ALBUMIN: 2.7 g/dL — AB (ref 3.4–5.0)
ANION GAP: 6 — AB (ref 7–16)
Alkaline Phosphatase: 190 U/L — ABNORMAL HIGH
BILIRUBIN TOTAL: 0.4 mg/dL (ref 0.2–1.0)
BUN: 9 mg/dL (ref 7–18)
Calcium, Total: 8.1 mg/dL — ABNORMAL LOW (ref 8.5–10.1)
Chloride: 105 mmol/L (ref 98–107)
Co2: 29 mmol/L (ref 21–32)
Creatinine: 0.97 mg/dL (ref 0.60–1.30)
EGFR (Non-African Amer.): >60
GLUCOSE: 87 mg/dL (ref 65–99)
OSMOLALITY: 277 (ref 275–301)
POTASSIUM: 2.9 mmol/L — AB (ref 3.5–5.1)
SGOT(AST): 15 U/L (ref 15–37)
SGPT (ALT): 14 U/L (ref 12–78)
Sodium: 140 mmol/L (ref 136–145)
Total Protein: 7.1 g/dL (ref 6.4–8.2)

## 2013-07-02 LAB — TSH: Thyroid Stimulating Horm: 2.86 u[IU]/mL

## 2013-07-02 LAB — PRO B NATRIURETIC PEPTIDE: B-Type Natriuretic Peptide: 24824 pg/mL — ABNORMAL HIGH (ref 0–450)

## 2013-07-02 LAB — MAGNESIUM: Magnesium: 1.4 mg/dL — ABNORMAL LOW

## 2013-07-02 LAB — TROPONIN I: Troponin-I: 0.03 ng/mL

## 2013-07-08 ENCOUNTER — Ambulatory Visit: Payer: Self-pay | Admitting: Vascular Surgery

## 2013-07-11 ENCOUNTER — Encounter: Payer: Self-pay | Admitting: Surgery

## 2013-07-12 ENCOUNTER — Emergency Department: Payer: Self-pay | Admitting: Emergency Medicine

## 2013-07-12 LAB — COMPREHENSIVE METABOLIC PANEL
ALT: 16 U/L (ref 12–78)
ANION GAP: 11 (ref 7–16)
Albumin: 2.5 g/dL — ABNORMAL LOW (ref 3.4–5.0)
Alkaline Phosphatase: 191 U/L — ABNORMAL HIGH
BUN: 11 mg/dL (ref 7–18)
Bilirubin,Total: 0.7 mg/dL (ref 0.2–1.0)
CALCIUM: 8.1 mg/dL — AB (ref 8.5–10.1)
CO2: 21 mmol/L (ref 21–32)
Chloride: 104 mmol/L (ref 98–107)
Creatinine: 0.73 mg/dL (ref 0.60–1.30)
EGFR (Non-African Amer.): >60
GLUCOSE: 79 mg/dL (ref 65–99)
Osmolality: 270 (ref 275–301)
Potassium: 3.6 mmol/L (ref 3.5–5.1)
SGOT(AST): 20 U/L (ref 15–37)
Sodium: 136 mmol/L (ref 136–145)
Total Protein: 6.9 g/dL (ref 6.4–8.2)

## 2013-07-12 LAB — CBC
HCT: 40.6 % (ref 40.0–52.0)
HGB: 13 g/dL (ref 13.0–18.0)
MCH: 29.2 pg (ref 26.0–34.0)
MCHC: 32 g/dL (ref 32.0–36.0)
MCV: 91 fL (ref 80–100)
PLATELETS: 293 10*3/uL (ref 150–440)
RBC: 4.45 10*6/uL (ref 4.40–5.90)
RDW: 15.8 % — AB (ref 11.5–14.5)
WBC: 8.5 10*3/uL (ref 3.8–10.6)

## 2013-07-12 LAB — URINALYSIS, COMPLETE
Bacteria: NONE SEEN
Bilirubin,UR: NEGATIVE
Blood: NEGATIVE
Glucose,UR: NEGATIVE mg/dL (ref 0–75)
Ketone: NEGATIVE
LEUKOCYTE ESTERASE: NEGATIVE
Nitrite: NEGATIVE
PH: 5 (ref 4.5–8.0)
PROTEIN: NEGATIVE
RBC,UR: <1 /HPF (ref 0–5)
Specific Gravity: 1.017 (ref 1.003–1.030)
Squamous Epithelial: NONE SEEN
WBC UR: 1 /HPF (ref 0–5)

## 2013-07-12 LAB — TROPONIN I: Troponin-I: <0.02 ng/mL

## 2013-07-12 LAB — PRO B NATRIURETIC PEPTIDE: B-TYPE NATIURETIC PEPTID: 18083 pg/mL — AB (ref 0–450)

## 2013-07-24 ENCOUNTER — Other Ambulatory Visit: Payer: Self-pay | Admitting: Family Medicine

## 2013-07-24 LAB — URINALYSIS, COMPLETE
BILIRUBIN, UR: NEGATIVE
Blood: NEGATIVE
Glucose,UR: NEGATIVE mg/dL (ref 0–75)
Granular Cast: 2
Hyaline Cast: 33
KETONE: NEGATIVE
Leukocyte Esterase: NEGATIVE
NITRITE: NEGATIVE
Ph: 5 (ref 4.5–8.0)
Protein: 30
RBC,UR: 1 /HPF (ref 0–5)
Specific Gravity: 1.019 (ref 1.003–1.030)
Squamous Epithelial: NONE SEEN
WBC UR: 4 /HPF (ref 0–5)

## 2013-07-26 LAB — URINE CULTURE

## 2013-09-10 ENCOUNTER — Other Ambulatory Visit: Payer: Self-pay | Admitting: Family Medicine

## 2013-09-10 LAB — URINALYSIS, COMPLETE
BILIRUBIN, UR: NEGATIVE
BLOOD: NEGATIVE
Bacteria: NONE SEEN
Glucose,UR: NEGATIVE mg/dL (ref 0–75)
Hyaline Cast: 7
KETONE: NEGATIVE
Leukocyte Esterase: NEGATIVE
NITRITE: NEGATIVE
PH: 5 (ref 4.5–8.0)
PROTEIN: NEGATIVE
RBC,UR: 1 /HPF (ref 0–5)
Specific Gravity: 1.016 (ref 1.003–1.030)
Squamous Epithelial: NONE SEEN
WBC UR: NONE SEEN /HPF (ref 0–5)

## 2013-09-11 LAB — URINE CULTURE

## 2013-09-17 ENCOUNTER — Inpatient Hospital Stay: Payer: Self-pay | Admitting: Internal Medicine

## 2013-09-17 LAB — CBC WITH DIFFERENTIAL/PLATELET
Basophil #: 0 10*3/uL (ref 0.0–0.1)
Basophil %: 0.1 %
Eosinophil #: 0 10*3/uL (ref 0.0–0.7)
Eosinophil %: 0.3 %
HCT: 39.8 % — AB (ref 40.0–52.0)
HGB: 12.4 g/dL — ABNORMAL LOW (ref 13.0–18.0)
LYMPHS ABS: 1 10*3/uL (ref 1.0–3.6)
Lymphocyte %: 7.1 %
MCH: 27.5 pg (ref 26.0–34.0)
MCHC: 31.2 g/dL — ABNORMAL LOW (ref 32.0–36.0)
MCV: 88 fL (ref 80–100)
Monocyte #: 0.7 x10 3/mm (ref 0.2–1.0)
Monocyte %: 5 %
Neutrophil #: 11.9 10*3/uL — ABNORMAL HIGH (ref 1.4–6.5)
Neutrophil %: 87.5 %
Platelet: 223 10*3/uL (ref 150–440)
RBC: 4.52 10*6/uL (ref 4.40–5.90)
RDW: 17 % — ABNORMAL HIGH (ref 11.5–14.5)
WBC: 13.6 10*3/uL — ABNORMAL HIGH (ref 3.8–10.6)

## 2013-09-17 LAB — PROTIME-INR
INR: 1.4
PROTHROMBIN TIME: 16.7 s — AB (ref 11.5–14.7)

## 2013-09-17 LAB — COMPREHENSIVE METABOLIC PANEL
ALT: 36 U/L
AST: 57 U/L — AB (ref 15–37)
Albumin: 1.1 g/dL — ABNORMAL LOW (ref 3.4–5.0)
Alkaline Phosphatase: 155 U/L — ABNORMAL HIGH
Anion Gap: 9 (ref 7–16)
BUN: 22 mg/dL — AB (ref 7–18)
Bilirubin,Total: 0.3 mg/dL (ref 0.2–1.0)
CO2: 22 mmol/L (ref 21–32)
Calcium, Total: 6 mg/dL — CL (ref 8.5–10.1)
Chloride: 115 mmol/L — ABNORMAL HIGH (ref 98–107)
Creatinine: 1.16 mg/dL (ref 0.60–1.30)
EGFR (African American): >60
GFR CALC NON AF AMER: 59 — AB
GLUCOSE: 93 mg/dL (ref 65–99)
OSMOLALITY: 294 (ref 275–301)
POTASSIUM: 3.1 mmol/L — AB (ref 3.5–5.1)
Sodium: 146 mmol/L — ABNORMAL HIGH (ref 136–145)
Total Protein: 4.3 g/dL — ABNORMAL LOW (ref 6.4–8.2)

## 2013-09-17 LAB — MAGNESIUM: Magnesium: 1.4 mg/dL — ABNORMAL LOW

## 2013-09-17 LAB — URINALYSIS, COMPLETE
BILIRUBIN, UR: NEGATIVE
Bacteria: NONE SEEN
Blood: NEGATIVE
Glucose,UR: NEGATIVE mg/dL (ref 0–75)
Hyaline Cast: 40
Ketone: NEGATIVE
LEUKOCYTE ESTERASE: NEGATIVE
Nitrite: NEGATIVE
PROTEIN: NEGATIVE
Ph: 5 (ref 4.5–8.0)
RBC,UR: <1 /HPF (ref 0–5)
Specific Gravity: 1.017 (ref 1.003–1.030)
Squamous Epithelial: NONE SEEN
WBC UR: 1 /HPF (ref 0–5)

## 2013-09-17 LAB — PHOSPHORUS: Phosphorus: 3.5 mg/dL (ref 2.5–4.9)

## 2013-09-17 LAB — TROPONIN I: Troponin-I: 0.07 ng/mL — ABNORMAL HIGH

## 2013-09-18 ENCOUNTER — Ambulatory Visit: Payer: Self-pay | Admitting: Internal Medicine

## 2013-09-18 LAB — PHOSPHORUS: Phosphorus: 4.2 mg/dL (ref 2.5–4.9)

## 2013-09-18 LAB — CBC WITH DIFFERENTIAL/PLATELET
Basophil #: 0 10*3/uL (ref 0.0–0.1)
Basophil %: 0.2 %
Eosinophil #: 0 10*3/uL (ref 0.0–0.7)
Eosinophil %: 0.2 %
HCT: 36.8 % — ABNORMAL LOW (ref 40.0–52.0)
HGB: 11.5 g/dL — AB (ref 13.0–18.0)
LYMPHS PCT: 6.7 %
Lymphocyte #: 0.9 10*3/uL — ABNORMAL LOW (ref 1.0–3.6)
MCH: 27.1 pg (ref 26.0–34.0)
MCHC: 31.3 g/dL — AB (ref 32.0–36.0)
MCV: 87 fL (ref 80–100)
Monocyte #: 0.6 x10 3/mm (ref 0.2–1.0)
Monocyte %: 4.3 %
NEUTROS ABS: 12 10*3/uL — AB (ref 1.4–6.5)
Neutrophil %: 88.6 %
Platelet: 302 10*3/uL (ref 150–440)
RBC: 4.24 10*6/uL — ABNORMAL LOW (ref 4.40–5.90)
RDW: 17.1 % — ABNORMAL HIGH (ref 11.5–14.5)
WBC: 13.6 10*3/uL — ABNORMAL HIGH (ref 3.8–10.6)

## 2013-09-18 LAB — BASIC METABOLIC PANEL
Anion Gap: 10 (ref 7–16)
BUN: 24 mg/dL — AB (ref 7–18)
CALCIUM: 7.2 mg/dL — AB (ref 8.5–10.1)
CO2: 24 mmol/L (ref 21–32)
Chloride: 109 mmol/L — ABNORMAL HIGH (ref 98–107)
Creatinine: 1.46 mg/dL — ABNORMAL HIGH (ref 0.60–1.30)
EGFR (African American): 52 — ABNORMAL LOW
EGFR (Non-African Amer.): 44 — ABNORMAL LOW
Glucose: 107 mg/dL — ABNORMAL HIGH (ref 65–99)
Osmolality: 289 (ref 275–301)
Potassium: 3.5 mmol/L (ref 3.5–5.1)
SODIUM: 143 mmol/L (ref 136–145)

## 2013-09-18 LAB — HEPATIC FUNCTION PANEL A (ARMC)
ALK PHOS: 178 U/L — AB
Albumin: 1.7 g/dL — ABNORMAL LOW (ref 3.4–5.0)
BILIRUBIN TOTAL: 0.5 mg/dL (ref 0.2–1.0)
Bilirubin, Direct: 0.2 mg/dL (ref 0.00–0.20)
SGOT(AST): 56 U/L — ABNORMAL HIGH (ref 15–37)
SGPT (ALT): 39 U/L
Total Protein: 5.5 g/dL — ABNORMAL LOW (ref 6.4–8.2)

## 2013-09-18 LAB — MAGNESIUM: Magnesium: 2 mg/dL

## 2013-09-19 LAB — COMPREHENSIVE METABOLIC PANEL
ALK PHOS: 165 U/L — AB
ALT: 42 U/L
Albumin: 1.5 g/dL — ABNORMAL LOW (ref 3.4–5.0)
Anion Gap: 11 (ref 7–16)
BUN: 23 mg/dL — ABNORMAL HIGH (ref 7–18)
Bilirubin,Total: 0.6 mg/dL (ref 0.2–1.0)
Calcium, Total: 7 mg/dL — CL (ref 8.5–10.1)
Chloride: 107 mmol/L (ref 98–107)
Co2: 23 mmol/L (ref 21–32)
Creatinine: 1.67 mg/dL — ABNORMAL HIGH (ref 0.60–1.30)
EGFR (African American): 44 — ABNORMAL LOW
GFR CALC NON AF AMER: 38 — AB
GLUCOSE: 165 mg/dL — AB (ref 65–99)
OSMOLALITY: 289 (ref 275–301)
POTASSIUM: 3.2 mmol/L — AB (ref 3.5–5.1)
SGOT(AST): 103 U/L — ABNORMAL HIGH (ref 15–37)
Sodium: 141 mmol/L (ref 136–145)
TOTAL PROTEIN: 5.4 g/dL — AB (ref 6.4–8.2)

## 2013-09-19 LAB — URINE CULTURE

## 2013-09-19 LAB — CLOSTRIDIUM DIFFICILE(ARMC)

## 2013-09-21 DEATH — deceased

## 2013-09-22 LAB — CULTURE, BLOOD (SINGLE)

## 2013-10-22 DEATH — deceased

## 2014-05-11 IMAGING — CR DG CHEST 1V PORT
1 series · 1 of 1 positions shown · non-contrast
Comparison: none

REASON FOR EXAM: dyspnea
COMMENTS:

[portable]
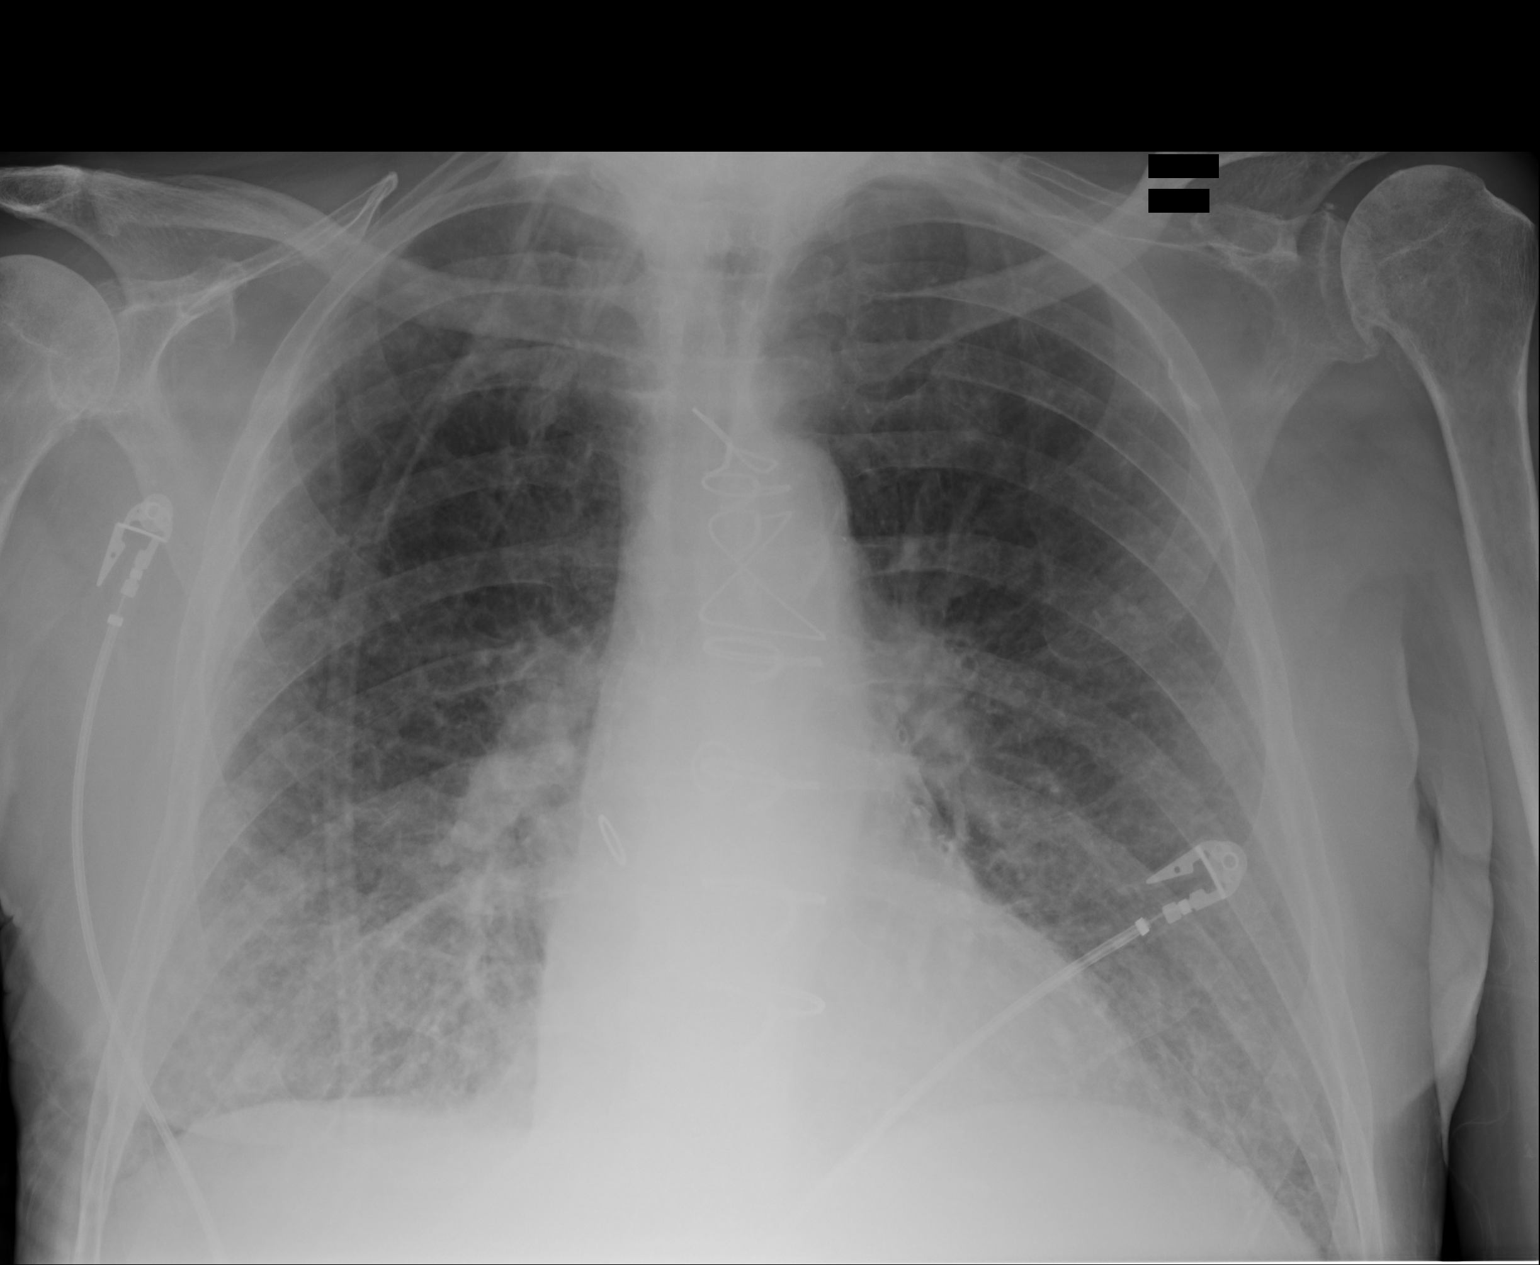

[1 of 1 positions shown; findings below may reference images not displayed]

PROCEDURE:     DXR - DXR PORTABLE CHEST SINGLE VIEW  - August 22, 2011  [DATE]

RESULT:     Comparison is made to the study 12 August, 2011.

The lungs are hyperinflated consistent with COPD. The lung markings are
coarse as noted previously. Cardiac monitoring electrodes are present. There
is no focal consolidation, pneumothorax, mass or effusion. Fullness is seen
in the hilar regions which could be from pulmonary vascular congestion or
adenopathy. The bones appear osteopenic. The cardiac silhouette appears to
be at the upper limits of normal.
IMPRESSION: 1. Mild hyperinflation consistent with COPD.
2. Sternotomy wires are present.
3. Changes suggestive of pulmonary edema with pulmonary vascular congestion.

[REDACTED]

## 2014-05-12 IMAGING — CR DG CHEST 2V
1 series · 2 of 2 positions shown · non-contrast
Comparison: none

REASON FOR EXAM: sob
COMMENTS:

[Series 1: pa · 0.17mm/px · 2 of 2 slices shown]
[im 1/2]
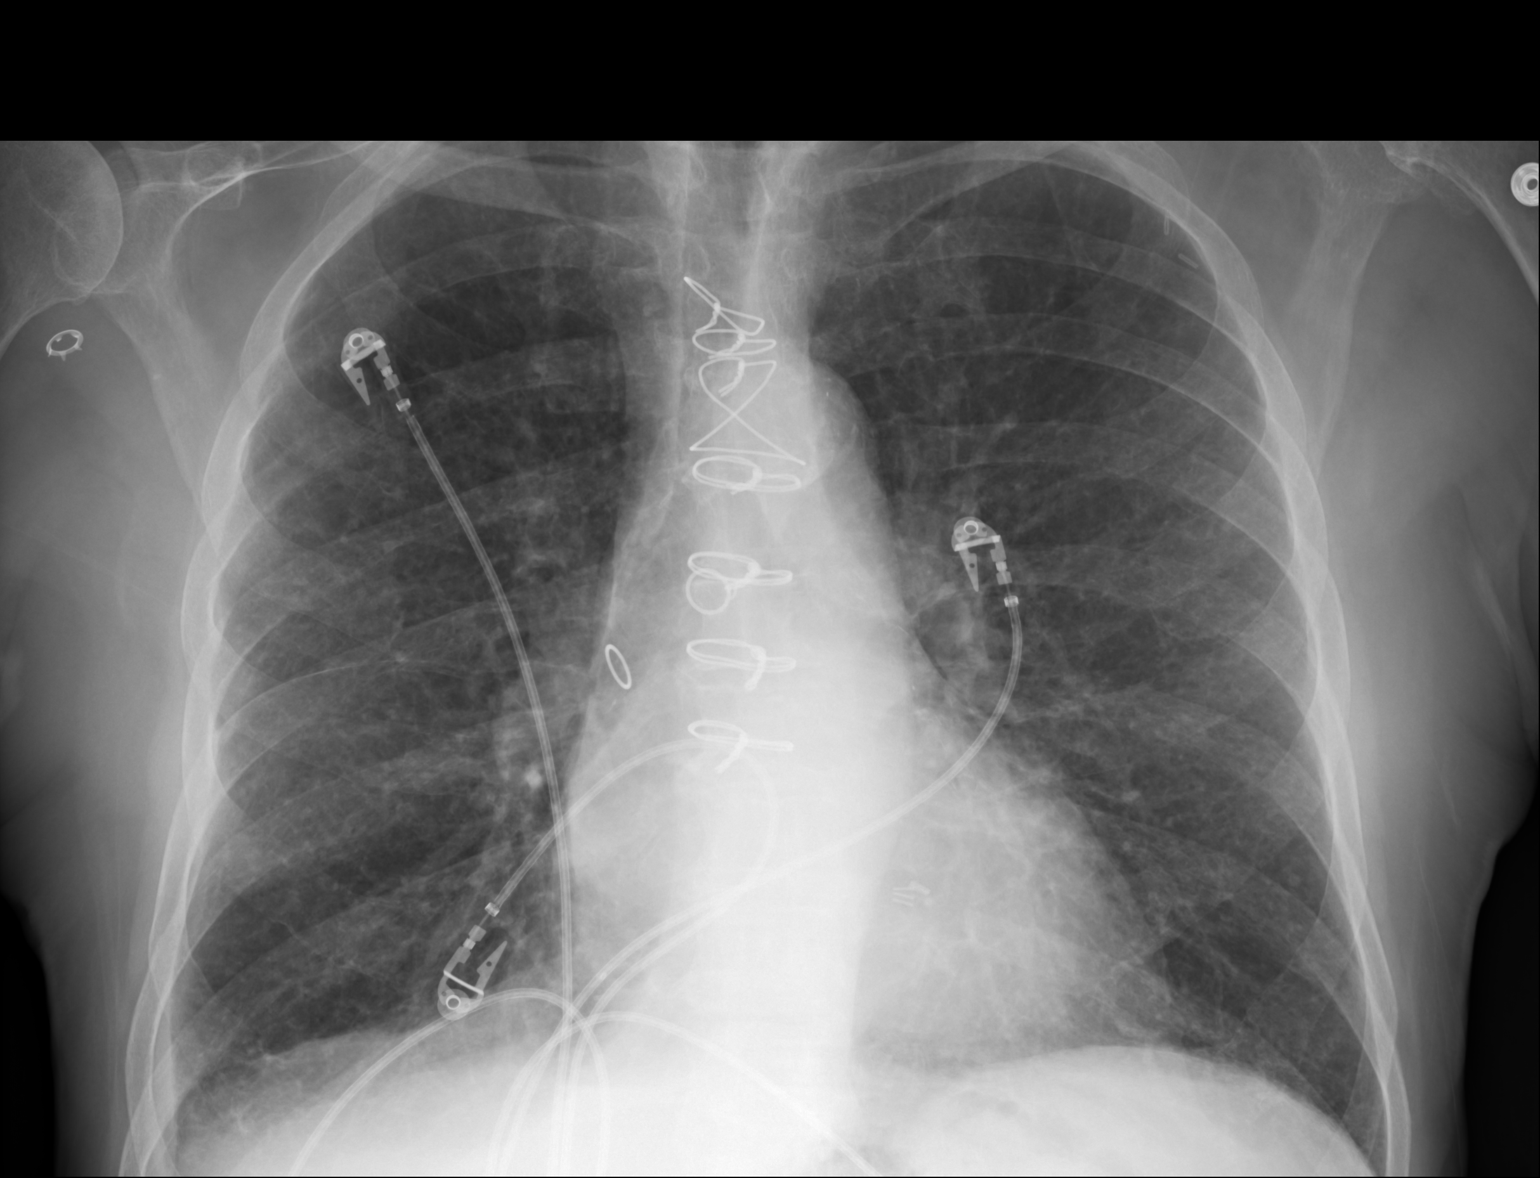
[im 2/2]
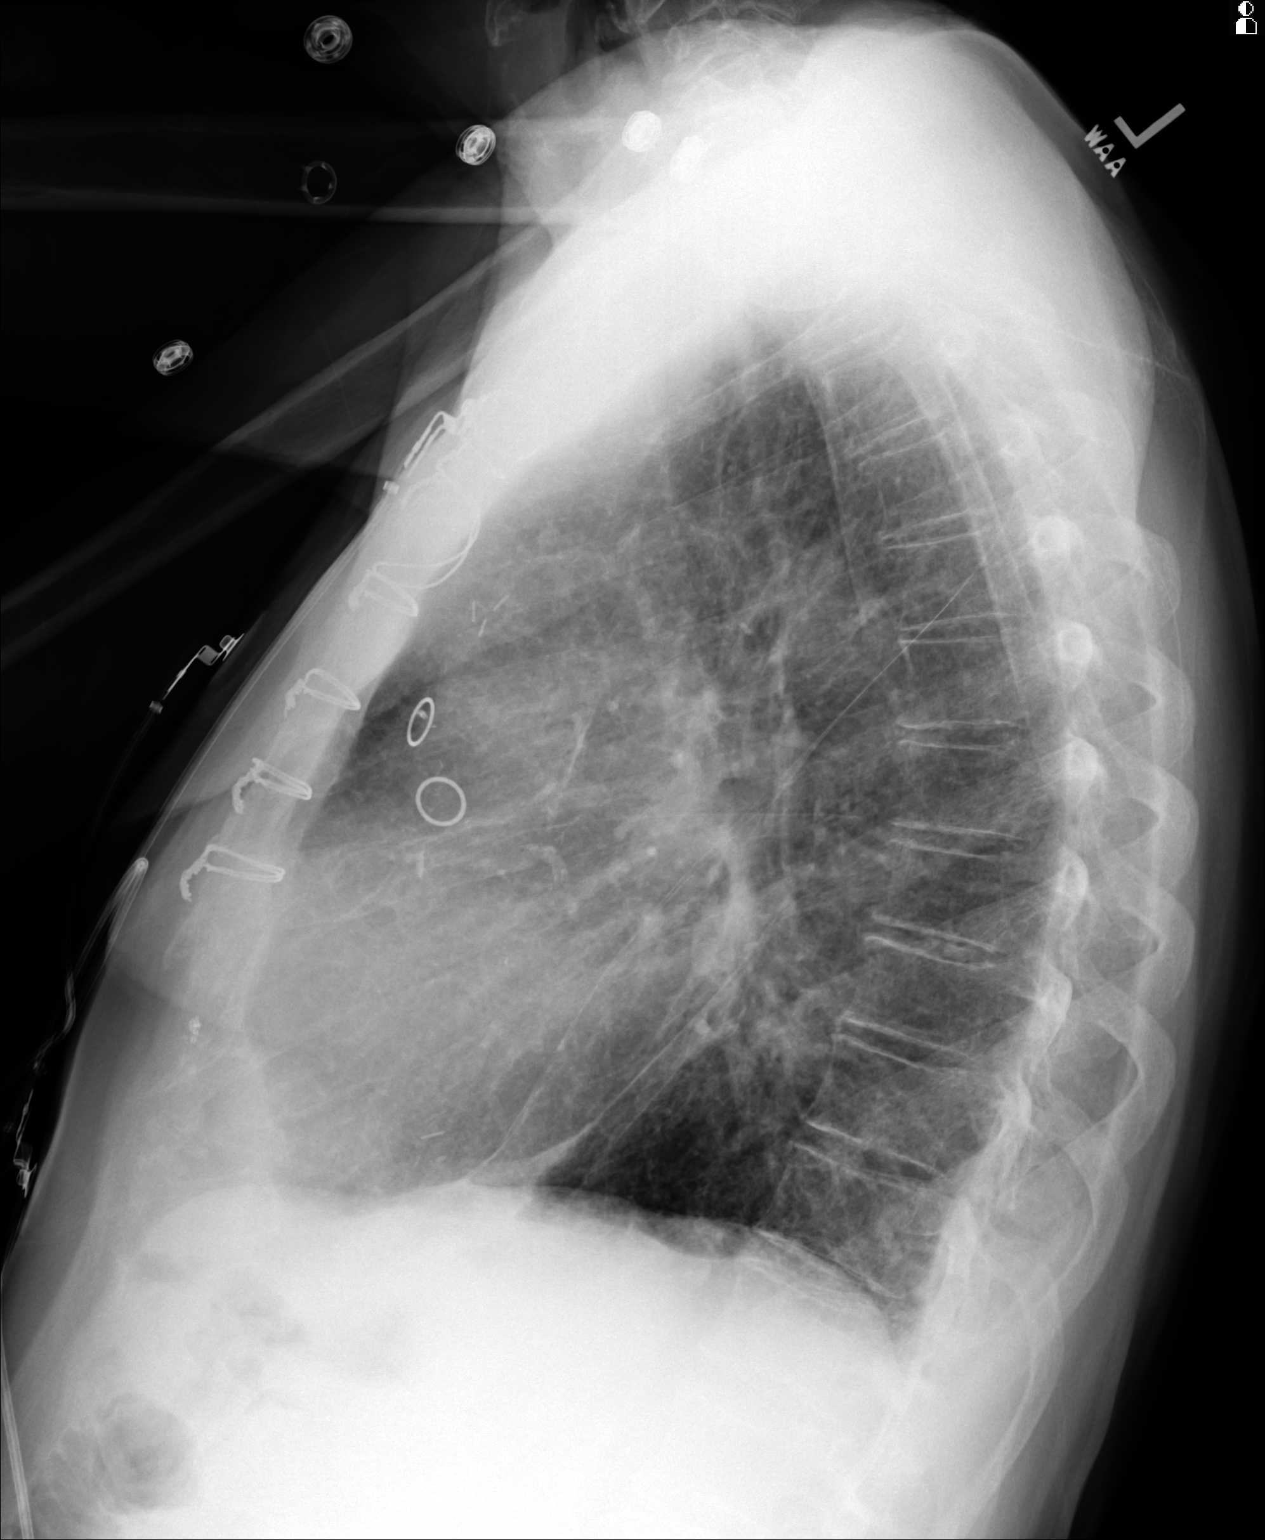

[2 of 2 positions shown; findings below may reference images not displayed]

PROCEDURE:     DXR - DXR CHEST PA (OR AP) AND LATERAL  - August 23, 2011  [DATE]

RESULT:     Comparison is made to the study August 22, 2011.

The lungs are mildly hyperinflated. The interstitial markings have improved
significantly since the earlier study. The cardiac silhouette is top normal
in size. The pulmonary vascularity is not engorged. There is no pleural
effusion. The patient has undergone previous CABG.
IMPRESSION: The findings are consistent with COPD. Interstitial edema
demonstrated previously has largely cleared.

## 2014-06-14 NOTE — Op Note (Signed)
PATIENT NAME:  David DoynePE, [name] MR#:  [id number] DATE OF BIRTH:  07/23/1929  DATE OF PROCEDURE:  04/15/2013  PREOPERATIVE DIAGNOSIS: Degenerative arthritis and femoral neck fracture, displaced left hip.   POSTOPERATIVE DIAGNOSIS:  Degenerative arthritis and femoral neck fracture, displaced left hip.   PROCEDURE: Left total hip replacement, anterior approach.   ANESTHESIA: General.   SURGEON: Kennedy BuckerMichael Susana Duell, M.D.   DESCRIPTION OF PROCEDURE: The patient was brought to the operating room and after adequate anesthesia was obtained, the patient was placed on the operative table with the left foot in the traction boot, right leg on a well-padded table. C-arm was brought in and initial x-ray taken of the right hip as a template for subsequent determination of length for the left hip. The hip was prepped and draped in the usual sterile fashion using barrier drape method. Appropriate patient identification, timeout procedures were carried out. Direct anterior approach was made with the incision centered over the tensor fascia lata and greater trochanter. The incision was carried down to the TFL. TFL fascia opened and the TFL retracted laterally. The deep fascia was incised and the lateral femoral circumflex vessels cauterized. The anterior capsule was then opened and modified Charnley retractor placed. The femoral neck cut was performed followed by removal of the femoral head, which showed mild to moderate degenerative changes. The acetabulum was then debrided with labrum excised, reaming carried out to 54 mm, at which point there was good bleeding bone. The 54 mm trial fit well and the 54 mm Versafitcup DM  was impacted into place. Next, the leg was externally rotated and brought in. The pubofemoral and ischiofemoral ligaments were released to allow for mobilization of the femur. The femur was dropped into extension with adduction after external rotation was obtained. Sequential broaching was carried out up to a  size 6. Size six stem with a short head gave appropriate leg length and good stability. Final components were chosen, impacted down the canal with the dual mobility head assembled, impacted and the hip reduced. The leg lengths appeared equal. The wound was thoroughly irrigated. 30 mL of 0.25% Sensorcaine with epinephrine was infiltrated for postop analgesia. The deep fascia was repaired using a heavy quill suture, subcutaneous Hemovac drain placed, followed by 2-0 quill subcutaneously and skin staples. Xeroform, 4 x 4's, ABD and tape applied. The patient was sent back to CCU for recovery and isolation secondary to active shingle lesions.   COMPLICATIONS: None.   SPECIMENS: Femoral head and neck.   ESTIMATED BLOOD LOSS: 500 mL.   IMPLANTS:  Medacta size 6, AMIS femoral stem, 54 mm Versafitcup DM and liner with an S-28 mm head.     ____________________________ Leitha SchullerMichael J. Cosme Jacob, MD mjm:dmm D: 04/15/2013 16:56:43 ET T: 04/15/2013 20:58:22 ET JOB#: [id number]  cc: Leitha SchullerMichael J. Zinedine Ellner, MD, <Dictator> Leitha SchullerMICHAEL J Keiondre Colee MD ELECTRONICALLY SIGNED 04/16/2013 7:56

## 2014-06-14 NOTE — H&P (Signed)
PATIENT NAME:  David Farley, David Farley MR#:  [id number] DATE OF BIRTH:  10/01/1929  DATE OF ADMISSION:  04/12/2013  PRIMARY CARE PHYSICIAN: Clayborn Bigness, MD  EMERGENCY DEPARTMENT REFERRING PHYSICIAN: Minna Antis, MD  CHIEF COMPLAINT: Status post fall with a hip fracture.   HISTORY OF PRESENT ILLNESS: The patient is an 81 year old white male with previous history of coronary artery disease status post CABG and dilated ischemic cardiomyopathy with a history of EF of 40% who has had some issues with cough and has been seen by pulmonary within the past 2 months. He is not able to tell me who the pulmonologist was and apparently he has been treated with some sort of antibiotic for the past 2 months. He states that he was doing okay, however, a few days ago he developed a rash on his right forehead and then subsequently it started involving his right eye and had redness in his conjunctiva. He did not have any pain in his eye or any vision problems so he was going to go see an ophthalmologist today. When he was walking in the driveway he fell and started having severe pain, therefore, he came to the ED.  In the ER, the patient had an x-ray of the left hip that showed a left femoral neck fracture. Therefore, he is being admitted. He reports that otherwise he has not had any fevers or chills. He has not had any shortness of breath. He has not had any chest pains or palpitations. He has not had any syncope. Denies any nausea, vomiting, diarrhea, or urinary symptoms.   PAST MEDICAL HISTORY: Significant for: 1.  Coronary artery disease status post CABG x3.  2.  Dilated ischemic cardiomyopathy. His last echocardiogram done in July 2013 here shows an EF of 25% to 30%, left ventricular systolic function is moderately to severely reduced, mild mitral regurg, tricuspid valve is not well visualized.  3.  Hypertension.  4.  Hyperlipidemia.  5.  Atrial fibrillation. 6.  Obstructive sleep apnea. The patient states he  cannot tolerate CPAP. 7.  Congestive heart failure, systolic in nature.   PAST SURGICAL HISTORY:  1.  Status post tonsillectomy. 2.  Laser eye surgery. 3.  CABG.   ALLERGIES: No known drug allergies.   SOCIAL HISTORY: Nonsmoker, does not drink and is retired.   FAMILY HISTORY: Negative for hypertension and coronary artery disease.   MEDICATIONS: The list is currently not available. The patient is unable to tell me what he is taking.   REVIEW OF SYSTEMS: CONSTITUTIONAL: Denies any fevers. No fatigue, weakness. Complains of pain in the left hip area. No weight loss. No weight gain.  EYES: No blurred or double vision. No pain. Does have redness in the right eye.  ENT: No tinnitus. No ear pain. No hearing loss. No seasonal or year-round allergies. No epistaxis. No nasal discharge. No difficulty swallowing.  RESPIRATORY: Does have chronic cough, wheezing. No hemoptysis. No dyspnea. No painful respiratory. No COPD. CARDIOVASCULAR: Denies any chest pain or orthopnea. Has history of atrial fibrillation.  GASTROINTESTINAL: No nausea, vomiting, diarrhea. No abdominal pain. No hematemesis. No melena.  GENITOURINARY: Denies any dysuria, hematuria, renal calculus, or frequency.  ENDOCRINE: Denies any polyuria, nocturia, or thyroid problems.  HEMATOLOGIC AND LYMPHATIC: Denies any easy bruisability or bleeding.  SKIN: No acne. No rash. No changes in mole or hair.  MUSCULOSKELETAL: Has pain in the hip. NEUROLOGIC: No numbness, CVA, TIA.  PSYCHIATRIC: Not anxious. No anxiety, insomnia, or ADD.   PHYSICAL EXAMINATION: VITAL  SIGNS: Temperature 98.1, pulse 92, respirations 20, blood pressure 109/69, O2 95%.  GENERAL: The patient is a well developed Caucasian male in no acute distress.  HEENT: Head atraumatic, normocephalic. Pupils equally round and reactive to light and accommodation. Right eye: He has conjunctival erythema. No scleral icterus. Nasal exam shows no drainage or ulceration. Oropharynx is  clear without any exudate.  NECK: Supple without any JVD.  CARDIOVASCULAR: Regular rate and rhythm. No murmurs, rubs, clicks, or gallops. PMI is not displaced.  LUNGS: Clear to auscultation bilaterally without any rales, rhonchi, or wheezing.  ABDOMEN: Soft, nontender, nondistended. Positive bowel sounds x4.  EXTREMITIES: No clubbing, cyanosis, or edema.  LYMPHATICS: No lymph nodes palpable.  VASCULAR: Scheduled diminished DP and PT pulses.  SKIN: He has an erythematous rash involving his forehead and right eye, erythema.  NEUROLOGIC: Awake, alert, and oriented x3. No focal deficits.  PSYCHIATRIC: Not anxious or depressed.   DIAGNOSTIC DATA: Chest x-ray shows no acute abnormality.  Hip x-ray shows subcapital left femoral neck fracture.   Labs: Glucose 95, BUN 23, creatinine 1.25, sodium 136, potassium 4.3, chloride 104, CO2 28, calcium 9.1. LFTs showed bili total of 1.2, otherwise, they were normal. WBC 5.4, hemoglobin 16.4, platelet count 195. INR 1.1.   ASSESSMENT AND PLAN: The patient is an 81 year old white male with history of congestive heart failure, hypertension, coronary artery disease, and hyperlipidemia who developed a rash on the right forehead, status post fall, now has hip fracture.  1.  Hip fracture, preop. The patient has no cardiopulmonary symptoms right now. His congestive heart failure appears to be compensated. His chest x-ray shows no acute cardiopulmonary process. The patient is at moderate to high risk of surgical complications; however, currently he is compensated and no further cardiopulmonary work-up is needed. Will be okay to proceed to operating room. Surgery is needed in light of if surgery is not done he will be bedridden for a prolonged time and that would lead to more complications.  2.  Herpes zoster involving the right face and ophthalmic nerve. At this time, the ED physician has spoken to Dr. Druscilla BrowniePorfilio who will see the patient for an eye exam. He recommends IV  acyclovir, which we will do every 8 hours.  3.  History of congestive heart failure. Currently compensated. Currently his medication list is not available. Will obtain home medications and resume his medications.  4.  Diagnosis of some pulmonary infection, on antibiotics for 2 months. Likely chronic bronchitis or bronchiectasis. We will obtain his home medication list and resume and treat.  5.  Hypertension. Blood pressure currently is stable or low. We will obtain his home blood pressure medications, but will not restart this.  6.  History of atrial fibrillation. Monitor on telemetry.  7.  Miscellaneous. Postop recommend incentive spirometry and Lovenox for deep vein thrombosis prophylaxis.   TIME SPENT: 55 minutes.  ____________________________ Lacie ScottsShreyang H. Allena KatzPatel, MD shp:sb D: 04/12/2013 16:30:46 ET T: 04/12/2013 16:59:37 ET JOB#: [id number]  cc: Tiger Spieker H. Allena KatzPatel, MD, <Dictator> Charise CarwinSHREYANG H Achaia Garlock MD ELECTRONICALLY SIGNED 04/26/2013 17:59

## 2014-06-14 NOTE — H&P (Signed)
PATIENT NAME:  David Farley, ENCINAS MR#:  [id number] DATE OF BIRTH:  September 01, 1929  DATE OF ADMISSION:  09/17/2013  PRIMARY CARE PHYSICIAN: Dr. Dorothey Baseman.   REFERRING EMERGENCY ROOM PHYSICIAN: Dr. Sharyn Creamer.  CHIEF COMPLAINT: Hypotension and confusion.   HISTORY OF PRESENT ILLNESS: An 81 year old male who is a nursing home resident, has multiple medical issues like coronary artery disease, status post bypass graft surgery, and history of cardiomyopathy with ejection fraction 25%-30%, hypertension, hyperlipidemia, atrial fibrillation, obstructive sleep apnea, congestive heart failure, and peripheral arterial disease requiring vascular interventions. The patient's history obtained from his friend [name], who is noted as a contact person in nursing home records.  As per him, patient was able to do his day-to-day activities until February of this year, when he fell down on the ice and had a fracture while trying to go to an ophthalmologist appointment.  Since then, he is living in nursing home and gradually getting worse and worse in his condition.  His friend, Mr. Rush Farmer is the only contact person.  As per him, he does not believe the patient has any other relatives.  Today, he was taken to Dr. Driscilla Grammes office for his regular appointment and, as per the friend,  he also saw him in the morning in nursing home before taking him to appointment and he was very lethargic and sleepy, unable arouse him and finally he went home and went to Dr. Driscilla Grammes office later on when his appointment was there.  Then he was also taken from nursing home by supportive staff for his appointment.  The nursing staff at Dr. Driscilla Grammes office checked his blood pressure, which was low and the patient was lethargic, so they suggested to take him to Emergency Room immediately, and he was brought over here and found having hypothermia and hypotension and multiple decubitus ulcers, which looks infected with some evidence of pneumonia on chest  x-ray.  Received central line and received IV fluids, but no response in blood pressure, so started on IV Levaquin drip, and given to hospitalist team for further management of his care.   REVIEW OF SYSTEMS: Unable to get it as the patient is currently confuse and not able to communicate well.   PAST MEDICAL HISTORY:  1.  Coronary artery disease, status post CABG x 3.  2.  Dilated ischemic cardiomyopathy with ejection fraction 25%- 30%, left ventricular systolic function is moderate to severe.  Reduced mild mitral regurgitation.  Tricuspid valve is not well visualized.  3.  Hypertension.  4.  Hyperlipidemia  5.  Atrial fibrillation.  6.  Operative sleep apnea.  7.  Congestive heart failure.   PAST SURGICAL HISTORY:  1.  Status post tonsillectomy.  2.  Laser eye surgery.  3.  CABG.   ALLERGIES: NO KNOWN DRUG ALLERGIES.   SOCIAL HISTORY: Nonsmoker, does not drink and retired.   FAMILY HISTORY: Negative for hypertension or coronary artery disease.   HOME MEDICATIONS: Currently being reviewed by pharmacy technician. Will update once available.   PHYSICAL EXAMINATION:  VITAL SIGNS: In the ER, temperature 93.8, pulse 61, respirations 15, blood pressure 75/55 on presentation, and it is still running 77/54. The patient is on Levaquin IV drip.  GENERAL: The patient is drowsy and lethargic, but easily arousable to move shaking and he opens eyes but does not communicate well, but able to follow simple commands like squeezing my hand. Does not move lower limbs.  HEENT: Head and neck atraumatic. Conjunctivae pink. Oral mucosa pale.  NECK: Supple. No JVD. Left internal jugular central line present.  RESPIRATORY: Bilateral equal air entry. There is some crepitation present.  CARDIOVASCULAR: S1, S2 present, regular. No murmur.  ABDOMEN: Soft, nontender. Bowel sounds present. No organomegaly.  SKIN: There is a scar of surgery present on the central chest and multiple decubitus ulcers in the groin,  in the buttocks and on both feet present. LEGS: No edema.  NEUROLOGICAL: The patient is lethargic.  Opens eyes to shaking and stimuli and follows very simple commands like squeezing hand.  Does not lower limbs to my stimuli. No rigidity or tremors.  PSYCHIATRIC: Unable to assess because of pathology.   IMPORTANT LABORATORY RESULTS:  1.  Chest x-ray interval development of pulmonary vascular congestion and interstitial pulmonary edema consistent with mild to moderate congestive heart failure.  2.  Slightly enlarged layering of right pleural effusion and right basilar opacity, progressed compared to prior, which may represent a combination of pleural fluid with atelectasis or infiltrate.  Glucose 93, BUN 22, creatinine 1.16, sodium 146, potassium is 3.1, chloride 115, CO2 22, potassium 6.0, phosphorus 3.5, magnesium 1.4. Total protein 4.3, albumin 1.1, bilirubin 0.3, alkaline phosphate 155, SGOT 57 and SGPT 36. Troponin 0.07. WBC 13.6, hemoglobin 12.4, platelet count 223, MCV is 88. Prothrombin time 16.7. INR is 1.4. Urinalysis is grossly negative ABG: A pH 7.23, pCO2 54, pO2 43 on FiO2 of 28 via nasal cannula.   ASSESSMENT AND PLAN: An 81 year old male with multiple past medical histories and nursing home brought in from Dr. Driscilla Grammesew's office because of hypotension and confusion, found having sepsis.  1.  Sepsis as evident by altered mental status, hypotension, hypokalemia, and elevated white cell count. The patient already received normal saline by ER, did not have good response, so started on Levaquin IV drip via central line.  Will continue the same and admit to Critical Care Unit. Currently, respiration is preserved and there is no distress, so we will continue monitoring. May need intubation.  Discussed with patient's only contact number, who is his friend, David Farley.  As per him, there are no other relatives and patient is a full code. Condition is extremely critical because of his presence of  septic shock, severe malnutrition, multiple medical issues, and high risk for cardiac respiratory arrest.  Called palliative consult to discuss further plan with his friend.   2.  Pneumonia. He is a nursing home resident and appears severely malnourished. There might be a component of dysphagia.  We will give him broad-spectrum antibiotics, currently keep him n.p.o. and get speech and swallow evaluation.  Blood cultures were sent.   3.  Decubitus ulcer, looks infected.  Calked surgical consults by ER physician. Follow up on that.  Patient is already on antibiotic   4.  Coronary artery disease, status post coronary artery bypass grafting and chronic systolic congestive heart failure, ejection fraction 25%-30%. Currently, because of hypotension and sepsis, the patient needs IV fluids.  We will continue the same.   5.  Hypertension. We will hold the medication as the patient is septic.    6.  Severe malnutrition.  Albumin level is 1.1.  We will get diet re-evaluation and we might have to get supplements once improved in condition and able to tolerate oral.   7.  Hypomagnesemia and hypokalemia: Will replace IV.   CONDITION: Critical.  TOTAL TIME SPENT ON THIS ADMISSION:  60 minutes disease.    ____________________________ Hope PigeonVaibhavkumar G. Elisabeth PigeonVachhani, MD vgv:ts D: 09/17/2013 17:41:05 ET T:  09/17/2013 17:57:51 ET JOB#: [id number]  cc: Hope Pigeon. Elisabeth Pigeon, MD, <Dictator> Teena Irani. Terance Hart, MD Altamese Dilling MD ELECTRONICALLY SIGNED 09/30/2013 8:14

## 2014-06-14 NOTE — Consult Note (Signed)
PATIENT NAME:  David Farley, [name] MR#:  [id number] DATE OF BIRTH:  [birth year]  DATE OF CONSULTATION:  09/17/2013  REFERRING PHYSICIAN:   CONSULTING PHYSICIAN:  Colen Eltzroth A. Egbert GaribaldiBird, MD  HISTORY OF PRESENT ILLNESS: This is an 81 year old male resident of nursing home referred to the Emergency Room with confusion, hypotension. Been treated in the past by the vascular surgeons for lower extremity ulcers. The patient was found to be lethargic. He went to the Emergency Room. He was found to have hypothermia and hypotension. Chest x-ray looks like pneumonia. Central line was placed, 2 liters were placed. The patient was placed on IV Levaquin. He was started on some pressors. Transferred to the intensive care unit. On primary survey in the Emergency Room, the patient was found to have some perineal cellulitis for which a CT scan of the abdomen and pelvis was performed which I personally reviewed. There is no evidence of gas or abscess formation. The patient currently is noncommunicative in the intensive care unit. No family is available.   PAST MEDICAL HISTORY: Significant for coronary artery disease, ischemic cardiomyopathy with an ejection fraction of 25% to 30%, hypertension, hyperlipidemia, atrial fibrillation, obstructive sleep apnea.   PAST SURGICAL HISTORY: Tonsillectomy, coronary artery bypass grafting.  ALLERGIES: None.   SOCIAL HISTORY: Nonsmoker.  FAMILY HISTORY: Unknown.   MEDICATIONS: Well-defined in the chart.   PHYSICAL EXAMINATION:  VITAL SIGNS:  The patient is hypothermic with a temperature of 93.8, pulse of 60, blood pressure in the Emergency Room on presentation was 77/54, after fluids was systolic 122/71. The patient is noncommunicative lethargic opens his eyes to voice, does not follow commands.  LUNGS: Coarse bilaterally.  HEART: Regular rate and rhythm.  ABDOMEN: Soft and nontender. Examination of his perineum demonstrates a Foley catheter in place. There is perineal cellulitis. There is  no sloughing of skin, blister formation, bullae formation or palpable abscess seen. This concurs with CT scan of the pelvis, which I personally reviewed.   LABORATORY VALUES: Electrolytes demonstrate sodium of 146, potassium 3.1, chloride 115. White count is 13.6, hemoglobin 12.4, hematocrit 39.8, platelet count 223,000. Pro time 16.7. Urinalysis is unremarkable.   IMPRESSION:  1.  Perineal cellulitis, buttock cellulitis, left side more than right.  There is also what appears to be a component of tinea corpora as well. 2.  Health care associated pneumonia.  3.  Sepsis.   PLAN: At present, I see no indication for operative intervention. I agree with vancomycin, Zosyn and azithromycin which should cover cellulitis in the perineum. We will follow with you. Add topital antifungal.   ___________________________ Redge GainerMark A. Egbert GaribaldiBird, MD mab:jh D: 09/17/2013 20:37:04 ET T: 09/17/2013 21:05:32 ET JOB#: [id number]  cc: Loraine LericheMark A. Egbert GaribaldiBird, MD, <Dictator> Shawnie Nicole A Xyler Terpening MD ELECTRONICALLY SIGNED 09/18/2013 1:28

## 2014-06-14 NOTE — Discharge Summary (Signed)
PATIENT NAME:  David Farley, CULBREATH MR#:  [id number] DATE OF BIRTH:  [birth year]  DATE OF ADMISSION:  09/17/2013 DATE OF DISCHARGE:  Oct 07, 2013  DEATH SUMMARY   PRIMARY CAUSE OF DEATH: Sepsis secondary to pneumonia.   OTHER CONTRIBUTING FACTORS: 1.  Decubitus ulcer. 2.  Coronary artery disease. 3.  Status post coronary artery bypass graft. 4.  Hypertension.  5.  Severe malnutrition.  6.  Hypomagnesemia and hypokalemia.  HISTORY OF PRESENT ILLNESS: This is an 81 year old male who lives in a nursing home, with multiple medical issues, ejection fraction 25% to 30%, who presented to hospital from Dr. Driscilla Grammes office as he was taken to Dr. Annice Needy office for routine visit for his peripheral vascular disease issues. He was found very lethargic and sleepy over there and they checked the blood pressure, which was running low, so they sent him to Emergency Room immediately and history obtained from the patient's friend on presentation, as per him, before a few months, until he had a fall and fracture, he was very independent in day-to-day activities, but since after the fracture he ended up in the nursing home and has gradual worsening in his condition over the last few months. In the ER, he was found having hypotension and there was evidence of pneumonia on chest x-ray so started on broad-spectrum antibiotics and vasopressor and admitted to CCU for further management.   HOSPITAL COURSE:  1.  Sepsis, secondary to pneumonia. He was started on Levophed IV drip and broad-spectrum antibiotic. Had gradual improvement in his blood pressure and we were able to taper Levophed drip slowly. The patient also has some improvement in his mental status and became a little more alert, in the next 2 days. Initially on admission he was a FULL code. We did not have much information about his power of attorney. Later on we found his friend was the healthcare power of attorney and he confirmed the patient being DNR so he was  converted to that status. Speech and swallow evaluation was done and started on diet. The patient was severely malnourished and had cardiac arrest on 10-08-2022. 2.  Decubitus ulcers and infection. The patient had bad decubitus ulcers and surgery and wound care consults were called in for these and we were managing accordingly.  3.  Coronary artery disease, status post coronary artery bypass graft surgery, with ejection fraction 25% to 30%. The patient was continued on some IV fluids because of hypotension and continued on medication.  4.  Hypertension. We held the medication because of sepsis and hypotension.  5.  Severe malnutrition. Albumin level was 1.1, and the patient was very thin. He was given IV albumin 1 time dose and speech and swallow and dietary consult was called in to help him with nutrition issues.  6.  Hypomagnesemia and hypokalemia. We replaced IV and kept as needed basis.  CONSULTATIONS IN HOSPITAL: Surgical with Dr. Natale Lay, palliative care with Dr. Harriett Sine Phifer.  DIAGNOSTIC DATA: Important lab results: WBC count 13.6, hemoglobin 12.4, platelet count 223,000, MCV 88.   Lactic acid 1 on admission. ABG: PH was 7.23, pCO2 54 and pO2 was 43 on 28% oxygen.   Blood culture did not grow any organism, up to 48 hours.   Creatinine 1.16, sodium 146, potassium 3.1, chloride 115, CO2 22, and calcium was 6. Bilirubin total 0.3, SGPT 36 and SGOT 57. Albumin 1.1. Total protein 4.3. Troponin level 0.07.   Chest x-ray, portable, single view, shows pulmonary vascular congestion and  interstitial pulmonary edema consistent with mild to moderate CHF, slightly enlarged layering right pleural effusion, right basilar opacity.   Urinalysis is negative.   Urine culture negative.   CT of abdomen and pelvis without contrast: Moderate right and small left pleural effusion. Wall thickening in colon at level of sigmoid and rectum mostly due to colitis or 3rd spacing. Peripheral ascites. Extensive  bilateral perinephric stranding. Gallbladder with periportal edema. Skin thickening overlying left gluteal region concerning of cellulitis.  The next day albumin came up to 1.7 after replacement.   On 30th of July, creatinine 1.67, sodium 141, potassium 3.2, chloride 107, and CO2 23. Calcium 7. Albumin 1.5. Total protein 5.4. C. diff was tested which was negative.   TOTAL TIME SPENT ON DISCHARGE: 35 minutes.   ____________________________ Hope PigeonVaibhavkumar G. Elisabeth PigeonVachhani, MD vgv:sb D: 09/20/2013 09:01:42 ET T: 09/20/2013 11:46:41 ET JOB#: [id number]  cc: Hope PigeonVaibhavkumar G. Elisabeth PigeonVachhani, MD, <Dictator> Altamese DillingVAIBHAVKUMAR Gwynn Chalker MD ELECTRONICALLY SIGNED 09/30/2013 8:14

## 2014-06-14 NOTE — H&P (Signed)
PATIENT NAME:  David DoynePE, [name] MR#:  [id number] DATE OF BIRTH:  April 12, 1929  DATE OF ADMISSION:  09/17/2013  ADDENDUM:  Patient's medication before admission as per nursing home records.  1.  Zinc sulfate 220 mg oral once a day.  2.  Vitamin C 500 mg oral 2 times a day.  3.  Tramadol 50 mg oral 3 times a day.  4.  Tegretol 200 mg oral 2 times a day. 5.  Tamsulosin 0.4 mg oral once a day. 6.  Simbrinza 0.2% and 1% eyedrops daily.  7.  Senna oral tablet 2 times a day.  8.  Multivitamin 1 tablet once a day.  9.  Lisinopril 500 mg oral once a day.  10.  Lasix 20 mg oral 2 times a day.  11.  Gabapentin 600 mg once a day at bedtime. 12.  Gabapentin 300 mg oral once a day in the morning.  13.  Colchicine 0.6 mg oral tablet once a day.  14.  Carvedilol 6.25 mg oral 2 times a day.  15.  Atorvastatin 20 mg oral once a day.  16.  Aspirin 81 mg once a day.  17.  Aspercreme 10% topical to affected area at sleep.  18.  Aricept 10 mg oral tablet once a day at bedtime.    ____________________________ Hope PigeonVaibhavkumar G. Elisabeth PigeonVachhani, MD vgv:ts D: 09/17/2013 17:43:39 ET T: 09/17/2013 17:52:51 ET JOB#: [id number]  cc: Hope PigeonVaibhavkumar G. Elisabeth PigeonVachhani, MD, <Dictator> Altamese DillingVAIBHAVKUMAR Brandolyn Shortridge MD ELECTRONICALLY SIGNED 09/30/2013 8:14

## 2014-06-14 NOTE — Consult Note (Signed)
Brief Consult Note: Diagnosis: perineal cellulitis, no drainable abscess.   Patient was seen by consultant.   Recommend further assessment or treatment.   Discussed with Attending MD.   Comments: broad spectrum antibiotics. will follow  I see not indication for surgical intervention at this time.  Electronic Signatures: Natale LayBird, Ly Bacchi (MD)  (Signed 28-Jul-15 20:32)  Authored: Brief Consult Note   Last Updated: 28-Jul-15 20:32 by Natale LayBird, Gershom Brobeck (MD)

## 2014-06-14 NOTE — Consult Note (Signed)
PATIENT NAME:  David Farley, David Farley MR#:  [id number] DATE OF BIRTH:  23-Feb-1929  DATE OF CONSULTATION:  04/12/2013  CONSULTING PHYSICIAN:  David HarkShalini Abriel Geesey, MD  CHIEF COMPLAINT: Left femoral neck fracture.   HISTORY OF PRESENT ILLNESS: [name] is an 81 year old gentleman who presents for evaluation of injury to the left hip. He lives independently at home. He does live with his companion, David Farley. He states that he actually tends to provide some care to Ms. David Farley. He explains that he was on his way to his ophthalmologist for evaluation of shingles over his  right eye. On exiting the office, he slipped on ice and fell, sustaining the aforementioned injury.   The patient currently is independently ambulatory. He is currently retired. He denies use of tobacco products.   PHYSICAL EXAMINATION:  LEFT LOWER EXTREMITY: The left hip is examined. No skin lesions, abrasions, significant ecchymosis. Calf is soft and nontender. Ankle dorsiflexion, plantar flexion are intact. DP and PT pulses are palpable at 1+. Skin is warm and well perfused. Capillary refill less than 2 seconds. SPN, DPN, tibial nerve sensation intact to light touch.    IMAGING STUDIES: Radiographs of the left hip reveal a displaced femoral neck fracture.   ASSESSMENT: Displaced femoral neck fracture, left hip.   PLAN: [name] is currently cleared for surgical intervention according to the medical note. We are still awaiting coags as these were not ordered on admission. All the risks and benefits of surgery were explained to the patient. A picture of the expected surgery was drawn for the patient for further understanding. Risks of nonoperative management were also explained. The patient is aware that oftentimes patients do experience a functional decline after this injury despite providing adequate surgery.   The patient does wish to proceed with surgery as soon as possible. Expected course of rehabilitation upon discharge was  discussed in detail. The patient is aware that as he will likely receive surgical intervention tomorrow, his surgery will be performed by Dr. Steele Sizerimothy Farley.   ____________________________ David HarkShalini Desarie Feild, MD sr:np D: 04/12/2013 19:07:56 ET T: 04/12/2013 19:36:18 ET JOB#: [id number]  cc: David HarkShalini Lynnann Knudsen, MD, <Dictator> David HarkSHALINI David Palma MD ELECTRONICALLY SIGNED 04/25/2013 9:42

## 2014-06-14 NOTE — Discharge Summary (Signed)
PATIENT NAME:  David Farley, David Farley MR#:  [id number] DATE OF BIRTH:  1929/09/01  DATE OF ADMISSION:  04/12/2013 DATE OF DISCHARGE:  04/17/2013  PRESENTING COMPLAINT: Fall and hip pain.   DISCHARGE DIAGNOSES: 1. Left hip fracture status post mechanical fall status post left total hip arthroplasty done by Dr. Rosita KeaMenz on 04/15/2013.  2. Herpes zoster herpes zoster ophthalmicus involving the right side.  3. History of congestive heart failure, well compensated.  4. Hypertension.  5. History of atrial fibrillation.  6. Hyperlipidemia.   CONDITION ON DISCHARGE: Fair.   CODE STATUS: FULL CODE   Vitals stable. The heart rate around 99 to 108, blood pressure is 147/78. Sats 92% to 95% on room air.   CONSULTATIONS: 1. Ophthalmology consultation, Dr. Fransico [facility]Brennan [facility].  2. Dr. Rosita KeaMenz, orthopedic.   BRIEF SUMMARY OF HOSPITAL COURSE: David Farley is an [age] year old Caucasian gentleman who has history of congestive heart failure, chronic systolic, history of hypertension, coronary artery disease, hyperlipidemia, who developed a rash on the right forehead, who was on his way to see ophthalmology. He slipped over the ice in the parking lot and developed a left hip fracture. He was admitted with:  1. Left hip fracture status post fall. The patient underwent surgery by Dr. Rosita KeaMenz on 02/23.  Postoperative he is doing well, tolerating PT well. [facility]  2. Right side zoster ophthalmicus seen by Dr. Fransico MichaelBrennan.  The patient was started on IV acyclovir along with several eye drops. His acyclovir is going to be changed to p.o. to complete a total 10 day course.  3. History of congestive heart failure, systolic currently compensated.  4. Hypertension. Stable. Home meds were resumed.  5. History of chronic atrial fibrillation.   Overall hospital stay remained stable. The patient is going to be discharged to rehab today.   Hemoglobin is 13.0, creatinine is 1.15, sodium is  133, potassium is 4, chloride is 101, bicarbonate is 22.   chest x-ray, no acute abnormality noted.   PT-INR within normal limits.   Urinalysis negative for urinary tract infection.   THE PATIENT'S HOME MEDICATIONS AND DISCHARGE MEDICATIONS: Are:  1. Bimatoprost  0.01% ophthalmic drops 1 drop both eyes at bedtime.  2. Tylenol 650 mg p.o. q.4 p.Farley.n.  3. Erythromycin 0.5% ophthalmic ointment one application right eye q.6 hourly stop after five days.  4. Ciloxan 0.3% ophthalmic drops one drop right eye 3 times a day. Stop after five days.  5.prednisolone  0.1% ophthalmic drops 1 drop to right eye t.i.d. Stop after five days.  6. Simbrinza 1 drop left eye  3 times a day.  7. Oxycodone 5- 10 q.4 p.Farley.n.  8. Senokot-S 1 tablet p.o. b.i.d.  9. Magnesium hydroxide 30 mL b.i.d. p.Farley.n.  10. Dulcolax 10 mg rectal suppository p.Farley.n.  11. Lovenox 30 mg subcutaneous b.i.d.  12. Acyclovir 800 mg 5 times a day for four more days.  13. Aspirin 81 mg daily.  14. Lisinopril 5 mg daily.  15. Zocor 20 mg at bedtime.  16. Gabapentin 300 mg daily.  17. Irbesartan 75 mg daily.  18. Tamsulosin 0.4 mg at bedtime.  19. Colcrys 0.6 mg daily p.Farley.n.  20. Coreg 3.125 b.i.d.  21. Lasix 20 mg p.o. daily p.Farley.n. for edema   TIME SPENT: 40 minutes.  ____________________________ Wylie HailSona A. Allena KatzPatel, MD sap:sg D: 04/17/2013 12:04:45 ET T: 04/17/2013 12:57:58 ET JOB#: [id number]  cc: Bralon Antkowiak A. Allena KatzPatel, MD, <Dictator> Claudette HeadMichael W. Brennan,  MD Leitha Schuller, MD  Willow Ora MD ELECTRONICALLY SIGNED 04/30/2013 15:20

## 2014-06-14 NOTE — Op Note (Signed)
PATIENT NAME:  David DoynePE, David Farley MR#:  [id number] DATE OF BIRTH:  1929/06/08  DATE OF PROCEDURE:  07/08/2013  PREOPERATIVE DIAGNOSES: 1.  Peripheral arterial disease with gangrene and ulceration, bilateral lower extremity.  2.  Coronary artery disease.  3.  Carotid artery disease.  4.  Hypertension.    POSTOPERATIVE DIAGNOSES: 1.  Peripheral arterial disease with gangrene and ulceration, bilateral lower extremity.  2.  Coronary artery disease.  3.  Carotid artery disease.  4.  Hypertension.   PROCEDURES: 1.  Ultrasound guidance for vascular access to bilateral femoral arteries.  2.  Right lower extremity angiogram from right femoral approach.  3.  Left lower extremity angiogram from left femoral approach.   SURGEON: Annice NeedyJason S. Azure Barrales, M.D.   ANESTHESIA: Local with moderate conscious sedation.   ESTIMATED BLOOD LOSS: 20 mL.   CONTRAST USED: Visipaque 50 mL.   INDICATION FOR PROCEDURE: This is an [age] year old gentleman with severe peripheral vascular disease. He has a large necrotic ulcer of the left heel. He has gangrenous changes in the tips of several toes on each lower extremity with punctate ulcerations. He has pain as well. Angiogram was performed for evaluation of his arterial status. He was previously found to have an aortic occlusion many years ago and had not followed up in our office. Risks and benefits were discussed. Informed consent was obtained.   DESCRIPTION OF PROCEDURE: The patient was brought to the vascular suite. Groins were shaved and prepped and a sterile surgical field was created.   Initially I accessed the right femoral artery which was heavily diseased and not pulsatile with a micropuncture needle. Micropuncture wire and sheath were then placed. A sheath could only be placed in at about 2 to 3 cm into the artery due to occlusive disease. Imaging was performed. He had a very diseased common femoral artery, profunda femoris artery. It appeared that the level of occlusion  extended down to the top of the femoral head of the distal external iliac artery on the right. He had collaterals with essentially no runoff distally, an SFA occlusion. A very small diseased anterior tibial artery had mild filling on the right but this was about it.  This was not continuous to the foot. The sheath was removed and I held pressure.   I then turned my attention to the left leg which was actually the more severely ulcerated leg. Similarly, ultrasound was used to access a very diseased left femoral artery with a micropuncture needle. Micropuncture wire and sheath were placed. The micropuncture sheath again would only pass in about 3 cm. This showed occlusion of the distal external iliac location on the left. The profunda was better on the left but there was a long SFA occlusion and what appeared to be only a small diseased peroneal distally with no other reconstitution and noncontinuous flow to the foot. At this point, the sheath was removed. Pressure was held.   There is no role for endovascular therapy at this point. It is unlikely that any surgical bypass including an aortobifemoral or axillary bifemoral bypass would maintain any patency given the lack of distal runoff on each side. The patient was taken to the recovery room in stable condition, having tolerated the procedure well.   ____________________________ Annice NeedyJason S. Gerda Yin, MD jsd:cs D: 07/08/2013 15:25:18 ET T: 07/08/2013 15:48:50 ET JOB#: [id number]  cc: Annice NeedyJason S. Dredyn Gubbels, MD, <Dictator> Burley SaverL. Katherine Bliss, MD Annice NeedyJASON S Argil Mahl MD ELECTRONICALLY SIGNED 07/22/2013 14:44

## 2014-06-15 NOTE — Discharge Summary (Signed)
PATIENT NAME:  David Farley, David Farley MR#:  [id number] DATE OF BIRTH:  05-31-1929  DATE OF ADMISSION:  08/22/2011 DATE OF DISCHARGE:  08/24/2011  DISCHARGE DIAGNOSES:  1. Acute on chronic respiratory failure likely due to chronic obstructive pulmonary disease and/or congestive heart failure exacerbation with echocardiogram showing ejection fraction of 25 to 35%, breathing close to baseline now. 2. Acute on chronic systolic and diastolic heart failure with ejection fraction of 25 to 35%, on ACE inhibitor, beta blocker and Lasix, stable.  3. Chronic obstructive pulmonary disease exacerbation, resolved while on steroids. The patient is requesting a nebulizer for home which has been provided. 4. History of atrial fibrillation, now in normal sinus rhythm.   SECONDARY DIAGNOSES:  1. Coronary artery disease.  2. Dilated ischemic cardiomyopathy.  3. Hypertension.  4. Hyperlipidemia.  5. History of atrial fibrillation.  6. Obstructive sleep apnea.   CONSULTATIONS:  1. Harold HedgeKenneth Fath, MD - Cardiology. 2. Physical Therapy.   PROCEDURES/RADIOLOGY: Chest x-ray on 08/23/2011 showed findings consistent with chronic obstructive pulmonary disease. Interstitial edema from previous x-ray has been cleared.   Chest x-ray on 08/22/2011 showed chronic obstructive pulmonary disease changes. Sternotomy wire present. Pulmonary edema pattern with pulmonary vascular congestion.   2-D echocardiogram on 08/22/2011 showed mildly dilated left atrium. Ejection fraction of 25 to 35%. Moderate to severely reduced LV systolic function. Mild mitral regurgitation and tricuspid regurgitation.   MAJOR LABORATORY PANEL: Urinalysis on admission was negative.   HISTORY AND SHORT HOSPITAL COURSE: The patient is a 81 year old male with the above-mentioned medical problems who was admitted for acute on chronic respiratory failure thought to be secondary to chronic obstructive pulmonary disease/congestive heart failure exacerbation and was  started on IV steroids and an antibiotic. Please see Dr. Margaretmary EddyShah's dictated history and physical for further details. Cardiology consultation was obtained with Dr. Lady Farley who recommended echocardiogram which was performed with the results dictated above. The patient was ruled out with three negative sets of cardiac enzymes. He was slowly improving on the above regimen and was feeling much better on 08/24/2011. He was evaluated by physical therapy and did well and was recommended discharge home.   DISCHARGE PHYSICAL EXAMINATION:   VITALS: On the date of discharge, his temperature was 96.5, heart rate 89 per minute, respirations 18 per minute, blood pressure 112-64 mmHg, and saturating 94% on 2 liters and would desaturate to the low 80s on room air. He was set up to get 2 liters oxygen via nasal cannula.   CARDIOVASCULAR: S1 and S2 normal. No murmurs, rubs, or gallops.   LUNGS: Clear to auscultation bilaterally. No wheezing, rales, rhonchi, or crepitation.   ABDOMEN: Soft and benign.   NEUROLOGIC: Nonfocal examination. All other physical examination remained at the baseline.   DISCHARGE MEDICATIONS:  1. Coreg 3.125 mg p.o. twice a day. 2. Cetirizine 10 mg p.o. daily.  3. Lisinopril 5 mg p.o. daily.  4. Diazepam 1 mg p.o. twice a day. 5. Gabapentin 300 mg p.o. daily.  6. Simvastatin 20 mg p.o. daily.  7. Astepro two sprays intranasally twice a day.  8. Protonix 40 mg p.o. daily.  9. Levaquin 750 mg p.o. daily.  10. Lasix 20 mg p.o. daily. 11. Spiriva once daily.  12. Flonase one puff inhaled twice a day.  13. DuoNebs every six hours as needed.  14. Prednisone 60 mg p.o. daily taper by 10 mg daily until finished.  15. Aspirin 81 mg p.o. daily.   DISCHARGE DIET: Low sodium, 1800 ADA.   DISCHARGE ACTIVITY:  As tolerated.   DISCHARGE INSTRUCTIONS AND FOLLOW-UP: The patient was instructed to follow-up with his primary care physician, Dr. Clayborn Farley, in 1 to 2 weeks. He will need follow-up  with cardiology, Dr. Lady Gary, in 2 to 4 weeks. He was set up to get 2 liters oxygen via nasal cannula continuous due to his hypoxia which is likely due to his underlying congestive heart failure and chronic obstructive pulmonary disease.  TOTAL TIME DISCHARGING THIS PATIENT: 55 minutes. ____________________________ Ellamae Sia. Sherryll Burger, MD vss:slb D: 08/26/2011 17:54:07 ET T: 08/29/2011 10:45:20 ET JOB#: [id number]  cc: Karolina Zamor S. Sherryll Burger, MD, <Dictator> Burley Saver, MD Darlin Priestly. David Gary, MD Patricia Pesa MD ELECTRONICALLY SIGNED 09/01/2011 22:30

## 2014-06-15 NOTE — Consult Note (Signed)
General Aspect patient is a 81 year old male with history of coronary artery disease status post coronary artery bypass grafting in 2011 with a left internal mammary to the LAD, saphenous vein graft to OM1 and RCA.  He also has a history of a dilated ischemic cardiomyopathy with ejection fraction of 20% prior to bypass surgery increasing 40% postop.  He also has a history of hypertension, hyperlipidemia and aortoiliac disease followed by vascular surgery.  He has a history of atrial fibrillation currently in sinus rhythm.  He was recently evaluated in [facility] for cough and viral type symptoms.  He was given Lasix with improvement.  He now presents to the emergency room with multiple complaints of shortness of breath chest discomfort and an irregular heartbeat.  Thus far he is ruled out for myocardial infarction.  Chest x-ray revealed evidence of probable COPD as well as mild volume overload.  He is improved with diuresis.  He is currently resting comfortably in bed.  He denies noncompliance with his medications.   Physical Exam:   GEN no acute distress    HEENT PERRL    NECK supple  No masses    RESP clear BS    CARD Regular rate and rhythm  Murmur    Murmur Systolic    Systolic Murmur axilla    ABD denies tenderness  normal BS    LYMPH negative neck    EXTR negative cyanosis/clubbing, negative edema    SKIN normal to palpation    NEURO cranial nerves intact, motor/sensory function intact    PSYCH A+O to time, place, person   Review of Systems:   Subjective/Chief Complaint shortness of breath and midsternal discomfort    General: No Complaints    Skin: No Complaints    ENT: No Complaints    Eyes: No Complaints    Neck: No Complaints    Respiratory: Frequent cough  Short of breath    Cardiovascular: Chest pain or discomfort    Gastrointestinal: No Complaints    Genitourinary: No Complaints    Vascular: No Complaints    Musculoskeletal: No Complaints     Neurologic: No Complaints    Hematologic: No Complaints    Endocrine: No Complaints    Psychiatric: No Complaints    Review of Systems: All other systems were reviewed and found to be negative    Medications/Allergies Reviewed Medications/Allergies reviewed     Multi-drug Resistant Organism (MDRO): Positive culture for MRSA., 19-Feb-2010   EF @ 15%: 2011   EF @ 44%: 2009   Sinusitis:    Sleep Apnea- obstructive:    Hyperlipidemia:    Neck Mass:    HTN:    Chronic A- Fib:    CHF:    Tonsillectomy:    Laser eye surgery: 2008  Home Medications: Medication Instructions Status  carvedilol 3.125 mg tablet 1 tab(s) orally 2 times a day  Active  cetirizine 10 mg oral tablet 1 tab(s) orally once a day Active  lisinopril 5 mg oral tablet 1 tab(s) orally once a day Active  diazepam 2 mg oral tablet 0.5 tab(s) orally 2 times a day Active  gabapentin 300 mg oral capsule 1 cap(s) orally once a day Active  simvastatin 20 mg oral tablet 1 tab(s) orally once a day Active  Astepro 205.5 mcg/inh (0.15%) nasal spray 2 spray(s) nasal 2 times a day Active   EKG:   EKG NSR   Radiology Results: XRay:    01-Jul-13 07:36, Chest Portable Single View  Chest Portable Single View    REASON FOR EXAM:    dyspnea  COMMENTS:       PROCEDURE: DXR - DXR PORTABLE CHEST SINGLE VIEW  - Aug 22 2011  7:36AM     RESULT: Comparison is made to the study of 12 August 2011.    The lungs are hyperinflated consistent with COPD. The lung markings are   coarse as noted previously. Cardiac monitoring electrodes are present.   There is no focal consolidation, pneumothorax, mass or effusion. Fullness   is seen in the hilar regions which could be from pulmonary vascular   congestion or adenopathy. The bones appear osteopenic. The cardiac   silhouette appears to be at the upper limits of normal.    IMPRESSION:   1. Mild hyperinflation consistent with COPD.  2. Sternotomy wires are present.  3.  Changes suggestive of pulmonary edema with pulmonary vascular   congestion.    Dictation Site: 2          Verified By: Elveria Royals, M.D., MD    No Known Allergies:     Impression 81 year old male with history of coronary artery disease status post coronary artery bypass grafting x3 with a history of a mild cardiomyopathy with an ejection fraction of 40% with recent echo.  He was recently seen in [facility] with complaints of shortness of breath.  He was given extra Lasix with improvement.  He was scheduled for a noninvasive workup but presented to the emergency room with complaints of shortness of breath and chest discomfort.  Chest x-ray reveals mild volume overload and he is ruled out for myocardial infarction thus far.  EKG revealed no ischemia. etiology of symptoms is unclear but volume overload versus reactive airway disease may be playing a role.  Ischemia may also be present however patient has ruled out thus far.  He has no significant arrhythmia on monitor so far    Plan 1.  Continue to rule out for myocardial infarction 2.  proceed with echocardiogram to evaluate left ventricular function and valves 3.  Low-fat, low-cholesterol, low-sodium diet 4.  Gentle diuresis 5.  Ambulate today and follow for evidence of arrhythmia or symptoms 6.  Should the patient have any significant ischemia would consider functional study in the morning however we'll review echo and make further recommendations after this is complete   Electronic Signatures: Dalia Heading (MD)  (Signed 01-Jul-13 14:09)  Authored: General Aspect/Present Illness, History and Physical Exam, Review of System, Past Medical History, Home Medications, EKG , Radiology, Allergies, Impression/Plan   Last Updated: 01-Jul-13 14:09 by Dalia Heading (MD)

## 2014-06-15 NOTE — H&P (Signed)
PATIENT NAME:  David Farley, David Farley MR#:  [id number] DATE OF BIRTH:  12-24-1929  DATE OF ADMISSION:  08/22/2011  PRIMARY CARE PHYSICIAN: Dr. Clayborn Bigness  REQUESTING PHYSICIAN: Dr. Lorenso Courier  CHIEF COMPLAINT: Shortness of breath and coughing.   HISTORY OF PRESENT ILLNESS: Patient is a 81 year old male with a known history of hypertension, hyperlipidemia, coronary artery disease status post coronary artery bypass graft is being admitted for suspected congestive heart failure and/or chronic obstructive pulmonary disease exacerbation. Patient has been coughing with dark yellow phlegm since last Monday. He got worse on Tuesday, could not sleep all night due to constant coughing, went to see his primary care physician on Wednesday and was prescribed oxygen on his second visit on Thursday but his breathing got worse over the weekend, was doing better initially after oxygen but kept getting worse and finally decided to come to the Emergency Department as he was having some chest and abdominal pain also. He has been feeling dizzy and was also having headache. Went to get pedicure done at [facility] and was feeling good, but it did not last too long and decided to come to the Emergency Department as his breathing kept getting worse along with coughing. While in the ED he was found to have tachycardia with a heart rate of 112 per minute, blood pressure 161/100 and he was placed on BiPAP as he was hypoxic as per the ED physician although I do not have any documented oxygen saturations. He is being admitted for further evaluation and management.   PAST MEDICAL HISTORY:  1. Coronary artery disease, status post coronary artery bypass graft x3.  2. Dilated ischemic cardiomyopathy with ejection fraction of 20% status post bypass increasing his ejection fraction of 40% postoperatively.  3. Hypertension.  4. Hyperlipidemia.  5. History of atrial fibrillation now in sinus rhythm, not on any anticoagulation.  6. History of  obstructive sleep apnea.  7. History of heart failure.   PAST SURGICAL HISTORY:   1. Tonsillectomy.  2. Laser eye surgery.  3. Coronary artery bypass graft.   ALLERGIES: No known drug allergies.   SOCIAL HISTORY: He is nonsmoker. No alcohol. He is married and retired.   FAMILY HISTORY: Negative for hypertension and coronary disease.   MEDICATIONS AT HOME:  1. Simvastatin 20 mg p.o. daily.  2. Astepro 2 sprays nasally twice a day.  3. Coreg 3.125 mg p.o. b.i.d.  4. Cetirizine 10 mg p.o. daily.  5. Diazepam 1 mg p.o. b.i.d.  6. Gabapentin 300 mg p.o. daily. 7. Lisinopril 5 mg p.o. daily.  REVIEW OF SYSTEMS: CONSTITUTIONAL: No fever, fatigue, weakness. EYES: No blurred or double vision. ENT: No tinnitus or ear pain. RESPIRATORY: Positive for cough with dark yellow sputum, also chest pain due to deep coughing and also shortness of breath. CARDIOVASCULAR: No palpitations. Positive for shortness of breath. No edema. GASTROINTESTINAL: No nausea, vomiting, diarrhea. GENITOURINARY: No dysuria, hematuria. ENDOCRINE: No polyuria, nocturia. HEMATOLOGY: No anemia, easy bruising. SKIN: No rash or lesion. MUSCULOSKELETAL: No arthritis or muscle cramp. NEUROLOGIC: No tingling, numbness, weakness. PSYCHIATRIC: No history of anxiety, depression.   PHYSICAL EXAMINATION:  VITAL SIGNS: Temperature 97.4, heart rate initially was 112 now is 100 per minute, respirations 22 per minute, blood pressure 161/100 mmHg in triage, now 150/82 mmHg. He was placed on BiPAP while in the Emergency Department and we have switched him to 2 liters via nasal cannula and has been saturating 92% on that.   GENERAL: Patient is a 81 year old male lying in the  bed comfortably without any acute distress.   EYES: Pupils equal, round, reactive to light and accommodation. No scleral icterus. Extraocular muscles intact.   HENT: Head atraumatic, normocephalic. Oropharynx and nasopharynx clear.   NECK: Supple. No jugular venous  distention. No thyroid enlargement or tenderness.   LUNGS: Decreased breath sounds at the bases. No wheezing, rales, rhonchi, crepitation.   CARDIOVASCULAR: S1, S2 normal, tachycardic. No murmurs, rales, or gallop.   ABDOMEN: Soft, nontender, nondistended. Bowel sounds present. No organomegaly or mass.   EXTREMITIES: No pedal edema, cyanosis or clubbing.   NEUROLOGIC: Nonfocal examination. Cranial nerves III through XII intact. Muscle strength 5/5 in all extremities. Sensation intact.   PSYCH: Patient is oriented to time, place, and person x3.   SKIN: No obvious rash, lesion, ulcer.  LABORATORY, DIAGNOSTIC AND RADIOLOGICAL DATA: Normal BMP except sodium 135 and chloride of 97.   BNP of 4381.   Normal liver function tests. Normal two sets of cardiac enzymes. Normal CBC except white count of 15.5. Normal coagulation panel.   Urinalysis was negative.   Chest x-ray showed no acute cardiopulmonary disease. Chronic obstructive pulmonary disease changes present. Changes suggestive of pulmonary edema with pulmonary vascular congestion present. Sternotomy wire present.   EKG showed no major ST-T changes, normal sinus rhythm.   IMPRESSION AND PLAN:  1. Acute on chronic respiratory failure likely secondary to chronic obstructive pulmonary disease and/or congestive heart failure exacerbation. Will start him on IV steroids, antibiotic, nebulizer breathing treatment. 2-D echocardiogram will be obtained. Consult cardiology. Obtain serial cardiac enzymes. Monitor him on telemetry.  2. Acute on chronic systolic and diastolic heart failure with last ejection fraction of 40% status post coronary artery bypass graft. Initially before coronary artery bypass graft his ejection fraction was running 15% to 20%. Post coronary artery bypass graft his ejection fraction has improved. Will continue ACE inhibitor, beta blocker. Start him on IV Lasix twice a day. Obtain daily weights, strict ins and outs. Congestive  heart failure education will be provided. Cardiology consult will be obtained and monitor him on telemetry. Serial cardiac enzymes.  3. Chronic obstructive pulmonary disease exacerbation. Will start him on IV steroids, nebulizer breathing treatment along with IV antibiotics.  4. Coronary artery disease, status post coronary artery bypass graft. Will start him on aspirin, statin, beta blocker. Check fasting lipid profile.  5. CODE STATUS: FULL CODE.   TOTAL TIME TAKING CARE OF THIS PATIENT: 55 minutes.    ____________________________ Ellamae SiaVipul S. Sherryll BurgerShah, MD vss:cms D: 08/22/2011 16:33:47 ET T: 08/22/2011 17:58:12 ET JOB#: [id number]  cc: Daxen Lanum S. Sherryll BurgerShah, MD, <Dictator> Burley SaverL. Katherine Bliss, MD Ellamae SiaVIPUL S [facility] MD ELECTRONICALLY SIGNED 08/24/2011 8:57
